# Patient Record
Sex: Male | Born: 1985 | Race: Black or African American | Hispanic: No | Marital: Single | Smoking: Former smoker
Health system: Southern US, Community
[De-identification: ages and names within clinical notes are randomized; demographics above are authoritative.]

## PROBLEM LIST (undated history)

## (undated) DIAGNOSIS — I10 Essential (primary) hypertension: Secondary | ICD-10-CM

## (undated) HISTORY — DX: Essential (primary) hypertension: I10

## (undated) HISTORY — PX: WISDOM TOOTH EXTRACTION: SHX21

---

## 2009-11-30 ENCOUNTER — Emergency Department (HOSPITAL_BASED_OUTPATIENT_CLINIC_OR_DEPARTMENT_OTHER): Admission: EM | Admit: 2009-11-30 | Discharge: 2009-11-30 | Payer: Self-pay | Admitting: Emergency Medicine

## 2009-11-30 ENCOUNTER — Ambulatory Visit: Payer: Self-pay | Admitting: Diagnostic Radiology

## 2009-12-16 ENCOUNTER — Emergency Department (HOSPITAL_BASED_OUTPATIENT_CLINIC_OR_DEPARTMENT_OTHER): Admission: EM | Admit: 2009-12-16 | Discharge: 2009-12-16 | Payer: Self-pay | Admitting: Emergency Medicine

## 2010-12-03 ENCOUNTER — Emergency Department (HOSPITAL_BASED_OUTPATIENT_CLINIC_OR_DEPARTMENT_OTHER)
Admission: EM | Admit: 2010-12-03 | Discharge: 2010-12-03 | Payer: 59 | Source: Home / Self Care | Admitting: Emergency Medicine

## 2010-12-26 ENCOUNTER — Emergency Department (HOSPITAL_COMMUNITY)
Admission: EM | Admit: 2010-12-26 | Discharge: 2010-12-26 | Payer: Self-pay | Source: Home / Self Care | Admitting: Emergency Medicine

## 2011-02-11 LAB — RPR: RPR Ser Ql: NONREACTIVE

## 2011-02-11 LAB — URINALYSIS, ROUTINE W REFLEX MICROSCOPIC
Bilirubin Urine: NEGATIVE
Glucose, UA: NEGATIVE mg/dL
Hgb urine dipstick: NEGATIVE
Ketones, ur: NEGATIVE mg/dL
Nitrite: NEGATIVE
Protein, ur: NEGATIVE mg/dL
Specific Gravity, Urine: 1.006 (ref 1.005–1.030)
Urobilinogen, UA: 0.2 mg/dL (ref 0.0–1.0)
pH: 7 (ref 5.0–8.0)

## 2011-02-11 LAB — GC/CHLAMYDIA PROBE AMP, GENITAL
Chlamydia, DNA Probe: NEGATIVE
GC Probe Amp, Genital: NEGATIVE

## 2015-05-22 ENCOUNTER — Emergency Department (HOSPITAL_COMMUNITY)
Admission: EM | Admit: 2015-05-22 | Discharge: 2015-05-23 | Disposition: A | Discharge status: Home / Self Care | Payer: 59 | Attending: Emergency Medicine | Admitting: Emergency Medicine

## 2015-05-22 ENCOUNTER — Encounter (HOSPITAL_COMMUNITY): Payer: Self-pay | Admitting: Emergency Medicine

## 2015-05-22 DIAGNOSIS — S61252A Open bite of right middle finger without damage to nail, initial encounter: Secondary | ICD-10-CM | POA: Diagnosis not present

## 2015-05-22 DIAGNOSIS — Y9289 Other specified places as the place of occurrence of the external cause: Secondary | ICD-10-CM | POA: Diagnosis not present

## 2015-05-22 DIAGNOSIS — Z720 Tobacco use: Secondary | ICD-10-CM | POA: Diagnosis not present

## 2015-05-22 DIAGNOSIS — S61259A Open bite of unspecified finger without damage to nail, initial encounter: Secondary | ICD-10-CM

## 2015-05-22 DIAGNOSIS — W57XXXA Bitten or stung by nonvenomous insect and other nonvenomous arthropods, initial encounter: Secondary | ICD-10-CM | POA: Diagnosis not present

## 2015-05-22 DIAGNOSIS — Y998 Other external cause status: Secondary | ICD-10-CM | POA: Insufficient documentation

## 2015-05-22 DIAGNOSIS — Z23 Encounter for immunization: Secondary | ICD-10-CM | POA: Insufficient documentation

## 2015-05-22 DIAGNOSIS — W5581XA Bitten by other mammals, initial encounter: Secondary | ICD-10-CM

## 2015-05-22 DIAGNOSIS — Y9389 Activity, other specified: Secondary | ICD-10-CM | POA: Insufficient documentation

## 2015-05-22 MED ORDER — RABIES VACCINE, PCEC IM SUSR
1.0000 mL | Freq: Once | INTRAMUSCULAR | Status: AC
  Administered 2015-05-22: 1 mL via INTRAMUSCULAR
  Filled 2015-05-22: qty 1

## 2015-05-22 MED ORDER — RABIES IMMUNE GLOBULIN 150 UNIT/ML IM INJ
1500.0000 [IU] | INJECTION | Freq: Once | INTRAMUSCULAR | Status: AC
  Administered 2015-05-22: 1500 [IU]
  Filled 2015-05-22: qty 10

## 2015-05-22 NOTE — ED Provider Notes (Signed)
CSN: [id number]     Arrival date & time 05/22/15  2253 History  This chart was scribed for non-physician provider Trixie Dredge, PA-C, working with Loren Racer, MD by Phillis Haggis, ED Scribe. This patient was seen in room TR05C/TR05C and patient care was started at 11:32 PM.   Chief Complaint  Patient presents with  . Animal Bite   The history is provided by the patient. No language interpreter was used.    HPI Comments: Frederick Molina is a 29 y.o. male who presents to the Emergency Department complaining of an animal bite onset earlier this evening. He states that he caught a baby bat in his bag and was bitten by the bat on his right distal middle finger. He reports the bite was a pinching sensation, states that it feels like he touched a hot pan. He denies any bleeding from the bite. He denies calling animal control; states that he released the bat outside. He reports that his last tdap has been within the past 5 years. He denies numbness or weakness, fever, chills, nausea or vomiting.   History reviewed. No pertinent past medical history. History reviewed. No pertinent past surgical history. No family history on file. History  Substance Use Topics  . Smoking status: Current Every Day Smoker  . Smokeless tobacco: Not on file  . Alcohol Use: Yes    Review of Systems  Constitutional: Negative for fever and chills.  Respiratory: Negative for cough and shortness of breath.   Gastrointestinal: Negative for nausea, vomiting and abdominal pain.  Musculoskeletal: Negative for myalgias.  Skin: Negative for wound.       Bat bite to right middle finger  Neurological: Negative for weakness and numbness.  Psychiatric/Behavioral: Negative for self-injury.  All other systems reviewed and are negative.  Allergies  Review of patient's allergies indicates no known allergies.  Home Medications   Prior to Admission medications   Not on File   BP 136/87 mmHg  Pulse 85  Temp(Src) 98.2 F  (36.8 C) (Oral)  Resp 16  Ht 5\' 3"  (1.6 m)  Wt 161 lb (73.029 kg)  BMI 28.53 kg/m2  SpO2 99%  Physical Exam  Constitutional: He appears well-developed and well-nourished. No distress.  HENT:  Head: Normocephalic and atraumatic.  Neck: Neck supple.  Cardiovascular: Normal rate and regular rhythm.   Pulmonary/Chest: Effort normal and breath sounds normal. No respiratory distress. He has no wheezes. He has no rales.  Abdominal: Soft. He exhibits no distension and no mass. There is no tenderness. There is no rebound and no guarding.  Musculoskeletal:  Right hand with full active ROM, no erythema, edema, wounds noted, capillary refill <2 seconds.   Neurological: He is alert. He exhibits normal muscle tone.  Skin: He is not diaphoretic.  Nursing note and vitals reviewed.   ED Course  Procedures (including critical care time) DIAGNOSTIC STUDIES: Oxygen Saturation is 99% on RA, normal by my interpretation.    COORDINATION OF CARE: 11:35 PM-Discussed treatment plan which includes rabies vaccination with pt at bedside and pt agreed to plan.   Labs Review Labs Reviewed - No data to display  Imaging Review No results found.   EKG Interpretation None      MDM   Final diagnoses:  Bat bite of finger, initial encounter    Afebrile, nontoxic patient with reported bat bite to right middle finger.  No symptoms, no other complaints.  Rabies vaccine and immunoglobulin given.   D/C home with instructions for continued rabies  vaccination series, return precautions.  Discussed result, findings, treatment, and follow up  with patient.  Pt given return precautions.  Pt verbalizes understanding and agrees with plan.       I personally performed the services described in this documentation, which was scribed in my presence. The recorded information has been reviewed and is accurate.    Trixie Dredge, PA-C 05/23/15 0038  Loren Racer, MD 05/25/15 [phone]

## 2015-05-22 NOTE — ED Notes (Signed)
Pt. bitten by a bat at right distal middle finger this evening  , no bleeding or swelling .

## 2015-05-23 NOTE — Discharge Instructions (Signed)
Read the information below.  You may return to the Emergency Department at any time for worsening condition or any new symptoms that concern you.  If you develop redness, swelling, pus draining from the wound, or fevers greater than 100.4, return to the ER immediately for a recheck.   °

## 2015-05-25 ENCOUNTER — Encounter (HOSPITAL_COMMUNITY): Payer: Self-pay | Admitting: Emergency Medicine

## 2015-05-25 ENCOUNTER — Emergency Department (INDEPENDENT_AMBULATORY_CARE_PROVIDER_SITE_OTHER)
Admission: EM | Admit: 2015-05-25 | Discharge: 2015-05-25 | Disposition: A | Discharge status: Home / Self Care | Payer: 59 | Source: Home / Self Care

## 2015-05-25 DIAGNOSIS — Z203 Contact with and (suspected) exposure to rabies: Secondary | ICD-10-CM

## 2015-05-25 MED ORDER — RABIES VACCINE, PCEC IM SUSR: 1.0000 mL | Freq: Once | INTRAMUSCULAR | Status: DC

## 2015-05-25 MED ORDER — RABIES VACCINE, PCEC IM SUSR
INTRAMUSCULAR | Status: AC
  Filled 2015-05-25: qty 1

## 2015-05-25 MED ORDER — RABIES VACCINE, PCEC IM SUSR
1.0000 mL | Freq: Once | INTRAMUSCULAR | Status: AC
Start: 2015-05-25 — End: 2015-05-25
  Administered 2015-05-25: 1 mL via INTRAMUSCULAR

## 2015-05-25 NOTE — ED Provider Notes (Signed)
Patient presents to Urgent Care for rabies vaccine. He was seen in the ED on the 20th for a bat bite. He received the appropriate treatment and is in urgent care to continue his rabies vaccine series.   Rodolph Bong, MD 05/25/15 [phone]

## 2015-05-25 NOTE — ED Notes (Signed)
Here for day 3 rabies vaccination (#2) Voices no concerns Alert, no signs of acute distress.

## 2015-05-25 NOTE — Discharge Instructions (Signed)
Please return on 6/27 for #3 (day 7)

## 2015-05-30 ENCOUNTER — Emergency Department (HOSPITAL_COMMUNITY)
Admission: EM | Admit: 2015-05-30 | Discharge: 2015-05-30 | Disposition: A | Discharge status: Home / Self Care | Payer: Self-pay

## 2015-05-30 ENCOUNTER — Encounter (HOSPITAL_COMMUNITY): Payer: Self-pay

## 2015-05-30 ENCOUNTER — Emergency Department (INDEPENDENT_AMBULATORY_CARE_PROVIDER_SITE_OTHER)
Admission: EM | Admit: 2015-05-30 | Discharge: 2015-05-30 | Disposition: A | Discharge status: Home / Self Care | Payer: 59 | Source: Home / Self Care

## 2015-05-30 DIAGNOSIS — Z203 Contact with and (suspected) exposure to rabies: Secondary | ICD-10-CM

## 2015-05-30 MED ORDER — RABIES VACCINE, PCEC IM SUSR
1.0000 mL | Freq: Once | INTRAMUSCULAR | Status: AC
  Administered 2015-05-30: 1 mL via INTRAMUSCULAR

## 2015-05-30 NOTE — ED Notes (Signed)
Here for day #7 of rabies series initated in ED for reported bat bite

## 2015-06-06 ENCOUNTER — Emergency Department (INDEPENDENT_AMBULATORY_CARE_PROVIDER_SITE_OTHER)
Admission: EM | Admit: 2015-06-06 | Discharge: 2015-06-06 | Disposition: A | Discharge status: Home / Self Care | Payer: 59 | Source: Home / Self Care

## 2015-06-06 ENCOUNTER — Encounter (HOSPITAL_COMMUNITY): Payer: Self-pay

## 2015-06-06 DIAGNOSIS — Z203 Contact with and (suspected) exposure to rabies: Secondary | ICD-10-CM

## 2015-06-06 MED ORDER — RABIES VACCINE, PCEC IM SUSR
1.0000 mL | Freq: Once | INTRAMUSCULAR | Status: AC
  Administered 2015-06-06: 1 mL via INTRAMUSCULAR

## 2015-06-06 NOTE — ED Notes (Signed)
Here for rabies shot , NAD

## 2015-08-09 ENCOUNTER — Encounter (HOSPITAL_COMMUNITY): Payer: Self-pay | Admitting: *Deleted

## 2015-08-09 ENCOUNTER — Emergency Department (HOSPITAL_COMMUNITY)
Admission: EM | Admit: 2015-08-09 | Discharge: 2015-08-09 | Disposition: A | Discharge status: Home / Self Care | Payer: 59 | Attending: Emergency Medicine | Admitting: Emergency Medicine

## 2015-08-09 DIAGNOSIS — Z720 Tobacco use: Secondary | ICD-10-CM | POA: Insufficient documentation

## 2015-08-09 DIAGNOSIS — H109 Unspecified conjunctivitis: Secondary | ICD-10-CM | POA: Diagnosis not present

## 2015-08-09 DIAGNOSIS — H578 Other specified disorders of eye and adnexa: Secondary | ICD-10-CM | POA: Diagnosis present

## 2015-08-09 MED ORDER — FLUORESCEIN SODIUM 1 MG OP STRP
1.0000 | ORAL_STRIP | Freq: Once | OPHTHALMIC | Status: AC
  Administered 2015-08-09: 1 via OPHTHALMIC
  Filled 2015-08-09: qty 1

## 2015-08-09 MED ORDER — TETRACAINE HCL 0.5 % OP SOLN
2.0000 [drp] | Freq: Once | OPHTHALMIC | Status: AC
  Administered 2015-08-09: 2 [drp] via OPHTHALMIC
  Filled 2015-08-09: qty 2

## 2015-08-09 MED ORDER — ERYTHROMYCIN 5 MG/GM OP OINT
1.0000 "application " | TOPICAL_OINTMENT | Freq: Once | OPHTHALMIC | Status: AC
  Administered 2015-08-09: 1 via OPHTHALMIC
  Filled 2015-08-09: qty 3.5

## 2015-08-09 NOTE — ED Provider Notes (Signed)
CSN: [id number]     Arrival date & time 08/09/15  1206 History  This chart was scribed for non-physician practitioner, Kerrie Buffalo, NP working with Azalia Bilis, MD by Evon Slack, ED scribe. This patient was seen in room TR04C/TR04C and the patient's care was started at 12:18 PM   Chief Complaint  Patient presents with  . Conjunctivitis   Patient is a 29 y.o. male presenting with eye problem. The history is provided by the patient. No language interpreter was used.  Eye Problem Location:  R eye Severity:  Mild Duration:  1 day Timing:  Constant Chronicity:  New Context: not direct trauma   Relieved by:  None tried Worsened by:  Bright light Associated symptoms: crusting, discharge, itching and redness    HPI Comments: Dionisio Aragones is a 29 y.o. male who presents to the Emergency Department complaining of new right eye redness onset this morning. Pt states that he has had associated discharge, itching and blurred vision this morning when waking up. Pt doesn't report any injury or trauma to the eye. Pt does report cutting grass yesterday and that something may have flew into his eye. 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 He states that today his eye has been feeling tight and dry. He denies using any medications on the eye. Pt doesn't report any other symptoms.   History reviewed. No pertinent past medical history. History reviewed. No pertinent past surgical history. No family history on file. Social History  Substance Use Topics  . Smoking status: Current Every Day Smoker  . Smokeless tobacco: None  . Alcohol Use: Yes    Review of Systems  Eyes: Positive for discharge, redness, itching and visual disturbance.  All other systems reviewed and are negative.  Visual acuity R 20/50, L 20/30  Allergies  Review of patient's allergies indicates no known allergies.  Home  Medications   Prior to Admission medications   Not on File   BP 118/72 mmHg  Pulse 78  Temp(Src) 98.3 F (36.8 C) (Oral)  Resp 16  Ht  (1.626 m)  Wt 157 lb (71.215 kg)  BMI 26.94 kg/m2  SpO2 98%   Physical Exam  Constitutional: He appears well-developed and well-nourished. No distress.  HENT:  Head: Normocephalic and atraumatic.  Eyes: EOM are normal. Pupils are equal, round, and reactive to light. Lids are everted and swept, no foreign bodies found. Right eye exhibits exudate. Right eye exhibits no hordeolum. No foreign body present in the right eye. Right conjunctiva is injected.  Fundoscopic exam:      The right eye shows no papilledema.  Slit lamp exam:      The right eye shows no fluorescein uptake.  Right eye scleral injected with exudate present.   Neck: Neck supple. No tracheal deviation present.  Cardiovascular: Normal rate.   Pulmonary/Chest: Effort normal. No respiratory distress.  Skin: Skin is warm and dry.  Psychiatric: He has a normal mood and affect. His behavior is normal.  Nursing note and vitals reviewed.   ED Course  Procedures   DIAGNOSTIC STUDIES: Oxygen Saturation is 100% on RA, normal by my interpretation.    COORDINATION OF CARE: 1:12 PM Discussed treatment plan with pt at bedside and pt agreed to plan. 1:22 PM- Pt will be discharged with erythromycin with first dose starting after discharge and will continue for 5-7 days. If symptoms persist will follow up  with optometrist.       MDM  29 y.o. 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 Will treat for conjunctivitis and he will follow up with opthalmology if symptoms persist.    Final diagnoses:  Conjunctivitis of right eye   I personally performed the services described in this documentation, which was scribed in my presence. The recorded information has been reviewed and is accurate.    [address] [place],  [place] 08/09/15 1551  Gerhard Munch, MD 08/09/15 [phone]

## 2015-08-09 NOTE — ED Notes (Signed)
Pt states that he woke up with "crust" on his eye. Pt states that his rt eye is itching and is red.

## 2015-08-09 NOTE — Discharge Instructions (Signed)
Use the eye ointment three times a day. Follow up with the eye doctor if symptoms persist.  Conjunctivitis Conjunctivitis is commonly called "pink eye." Conjunctivitis can be caused by bacterial or viral infection, allergies, or injuries. There is usually redness of the lining of the eye, itching, discomfort, and sometimes discharge. There may be deposits of matter along the eyelids. A viral infection usually causes a watery discharge, while a bacterial infection causes a yellowish, thick discharge. Pink eye is very contagious and spreads by direct contact. You may be given antibiotic eyedrops as part of your treatment. Before using your eye medicine, remove all drainage from the eye by washing gently with warm water and cotton balls. Continue to use the medication until you have awakened 2 mornings in a row without discharge from the eye. Do not rub your eye. This increases the irritation and helps spread infection. Use separate towels from other household members. Wash your hands with soap and water before and after touching your eyes. Use cold compresses to reduce pain and sunglasses to relieve irritation from light. Do not wear contact lenses or wear eye makeup until the infection is gone. SEEK MEDICAL CARE IF:   Your symptoms are not better after 3 days of treatment.  You have increased pain or trouble seeing.  The outer eyelids become very red or swollen. Document Released: 12/26/2004 Document Revised: 02/10/2012 Document Reviewed: 11/18/2005 [facility] Patient Information 2015 [place], [place]. This information is not intended to replace advice given to you by your health care provider. Make sure you discuss any questions you have with your health care provider.

## 2017-01-08 ENCOUNTER — Ambulatory Visit (HOSPITAL_COMMUNITY)
Admission: EM | Admit: 2017-01-08 | Discharge: 2017-01-08 | Disposition: A | Discharge status: Home / Self Care | Payer: 59 | Attending: Family Medicine | Admitting: Family Medicine

## 2017-01-08 ENCOUNTER — Encounter (HOSPITAL_COMMUNITY): Payer: Self-pay | Admitting: Emergency Medicine

## 2017-01-08 DIAGNOSIS — R6889 Other general symptoms and signs: Secondary | ICD-10-CM

## 2017-01-08 DIAGNOSIS — J111 Influenza due to unidentified influenza virus with other respiratory manifestations: Secondary | ICD-10-CM

## 2017-01-08 MED ORDER — HYDROCODONE-HOMATROPINE 5-1.5 MG/5ML PO SYRP: 5.0000 mL | ORAL_SOLUTION | Freq: Four times a day (QID) | ORAL | 0 refills | Status: DC | PRN

## 2017-01-08 MED ORDER — OSELTAMIVIR PHOSPHATE 75 MG PO CAPS: 75.0000 mg | ORAL_CAPSULE | Freq: Two times a day (BID) | ORAL | 0 refills | Status: DC

## 2017-01-08 MED ORDER — ACETAMINOPHEN 325 MG PO TABS
ORAL_TABLET | ORAL | Status: AC
  Filled 2017-01-08: qty 2

## 2017-01-08 MED ORDER — ACETAMINOPHEN 325 MG PO TABS
650.0000 mg | ORAL_TABLET | Freq: Once | ORAL | Status: AC
  Administered 2017-01-08: 650 mg via ORAL

## 2017-01-08 NOTE — Discharge Instructions (Signed)
I believe you have the influenza germ that has been going around. You need to get rest, push fluids, take ibuprofen as needed for fever and get started immediately on the influenza medicine tonight.

## 2017-01-08 NOTE — ED Provider Notes (Signed)
[facility]    CSN: [id number] Arrival date & time: 01/08/17  1939     History   Chief Complaint Chief Complaint  Patient presents with  . Fatigue  . Cough  . Nasal Congestion  . Chills    HPI Frederick Molina is a 31 y.o. male.   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 He started feeling like he was coming down with a cold last night and by this morning was running a fever and developed rhinorrhea and cough. He now has myalgia as well.  Patient's had no nausea or vomiting, no productive cough, no rash or sore throat.      History reviewed. No pertinent past medical history.  There are no active problems to display for this patient.   History reviewed. No pertinent surgical history.     Home Medications    Prior to Admission medications   Medication Sig Start Date End Date Taking? Authorizing Provider  HYDROcodone-homatropine (HYDROMET) 5-1.5 MG/5ML syrup Take 5 mLs by mouth every 6 (six) hours as needed for cough. 01/08/17   Elvina Sidle, MD  oseltamivir (TAMIFLU) 75 MG capsule Take 1 capsule (75 mg total) by mouth every 12 (twelve) hours. 01/08/17   Elvina Sidle, MD    Family History No family history on file.  Social History Social History  Substance Use Topics  . Smoking status: Former Games developer  . Smokeless tobacco: Former Neurosurgeon  . Alcohol use Yes     Allergies   Patient has no known allergies.   Review of Systems Review of Systems  Constitutional: Positive for chills, diaphoresis, fatigue and fever.  HENT: Positive for rhinorrhea. Negative for congestion and sore throat.   Respiratory: Positive for cough.   Gastrointestinal: Negative.   Genitourinary: Negative.   Musculoskeletal: Positive for myalgias.  Neurological: Positive for headaches.     Physical Exam Triage Vital Signs ED Triage Vitals  Enc Vitals Group     BP 01/08/17 2009 125/69     Pulse Rate 01/08/17 2009 99     Resp 01/08/17 2009 17      Temp 01/08/17 2009 101.8 F (38.8 C)     Temp Source 01/08/17 2009 Oral     SpO2 01/08/17 2009 99 %     Weight 01/08/17 2007 146 lb 5 oz (66.4 kg)     Height 01/08/17 2007 5\' 5"  (1.651 m)     Head Circumference --      Peak Flow --      Pain Score 01/08/17 2008 9     Pain Loc --      Pain Edu? --      Excl. in GC? --    No data found.   Updated Vital Signs BP 125/69 (BP Location: Right Arm)   Pulse 99   Temp 101.8 F (38.8 C) (Oral)   Resp 17   Ht 5\' 5"  (1.651 m)   Wt 146 lb 5 oz (66.4 kg)   SpO2 99%   BMI 24.35 kg/m    Physical Exam  Constitutional: He is oriented to person, place, and time. He appears well-developed and well-nourished.  HENT:  Right Ear: External ear normal.  Left Ear: External ear normal.  Mouth/Throat: Oropharynx is clear and moist.  Eyes: Conjunctivae and EOM are normal. Pupils are equal, round, and reactive to light.  Neck: Normal range of motion. Neck supple.  Cardiovascular: Normal rate, regular rhythm and normal heart sounds.   Pulmonary/Chest: Effort  normal and breath sounds normal.  Musculoskeletal: Normal range of motion.  Neurological: He is alert and oriented to person, place, and time.  Skin: Skin is warm and dry.  Nursing note and vitals reviewed.    UC Treatments / Results  Labs (all labs ordered are listed, but only abnormal results are displayed) Labs Reviewed - No data to display  EKG  EKG Interpretation None       Radiology No results found.  Procedures Procedures (including critical care time)  Medications Ordered in UC Medications  acetaminophen (TYLENOL) tablet 650 mg (650 mg Oral Given 01/08/17 2014)     Initial Impression / Assessment and Plan / UC Course  I have reviewed the triage vital signs and the nursing notes.  Pertinent labs & imaging results that were available during my care of the patient were reviewed by me and considered in my medical decision making (see chart for details).      Final Clinical Impressions(s) / UC Diagnoses   Final diagnoses:  Flu-like symptoms  Influenza-like syndrome    New Prescriptions New Prescriptions   HYDROCODONE-HOMATROPINE (HYDROMET) 5-1.5 MG/5ML SYRUP    Take 5 mLs by mouth every 6 (six) hours as needed for cough.   OSELTAMIVIR (TAMIFLU) 75 MG CAPSULE    Take 1 capsule (75 mg total) by mouth every 12 (twelve) hours.     Elvina SidleKurt Kosei Rhodes, MD 01/08/17 2048

## 2017-01-08 NOTE — ED Triage Notes (Signed)
Pt. Stated, Im fatigue  And I have a cough, this started this morning.

## 2018-05-01 ENCOUNTER — Encounter (HOSPITAL_COMMUNITY): Admission: EM | Disposition: A | Discharge status: Rehabilitation Facility | Payer: Self-pay | Source: Home / Self Care

## 2018-05-01 ENCOUNTER — Encounter (HOSPITAL_COMMUNITY): Payer: Self-pay | Admitting: Emergency Medicine

## 2018-05-01 ENCOUNTER — Inpatient Hospital Stay (HOSPITAL_COMMUNITY)
Admission: EM | Admit: 2018-05-01 | Discharge: 2018-06-04 | DRG: 955 | Disposition: A | Discharge status: Rehabilitation Facility | Payer: Self-pay | Attending: General Surgery | Admitting: General Surgery

## 2018-05-01 ENCOUNTER — Emergency Department (HOSPITAL_COMMUNITY): Payer: Self-pay

## 2018-05-01 ENCOUNTER — Other Ambulatory Visit: Payer: Self-pay

## 2018-05-01 DIAGNOSIS — J9600 Acute respiratory failure, unspecified whether with hypoxia or hypercapnia: Secondary | ICD-10-CM

## 2018-05-01 DIAGNOSIS — Z23 Encounter for immunization: Secondary | ICD-10-CM

## 2018-05-01 DIAGNOSIS — I959 Hypotension, unspecified: Secondary | ICD-10-CM | POA: Diagnosis present

## 2018-05-01 DIAGNOSIS — E870 Hyperosmolality and hypernatremia: Secondary | ICD-10-CM | POA: Diagnosis not present

## 2018-05-01 DIAGNOSIS — S080XXA Avulsion of scalp, initial encounter: Secondary | ICD-10-CM | POA: Diagnosis present

## 2018-05-01 DIAGNOSIS — S0291XB Unspecified fracture of skull, initial encounter for open fracture: Secondary | ICD-10-CM | POA: Diagnosis present

## 2018-05-01 DIAGNOSIS — L8990 Pressure ulcer of unspecified site, unspecified stage: Secondary | ICD-10-CM

## 2018-05-01 DIAGNOSIS — S40012A Contusion of left shoulder, initial encounter: Secondary | ICD-10-CM | POA: Diagnosis present

## 2018-05-01 DIAGNOSIS — Y92410 Unspecified street and highway as the place of occurrence of the external cause: Secondary | ICD-10-CM

## 2018-05-01 DIAGNOSIS — S2243XA Multiple fractures of ribs, bilateral, initial encounter for closed fracture: Secondary | ICD-10-CM | POA: Diagnosis present

## 2018-05-01 DIAGNOSIS — K567 Ileus, unspecified: Secondary | ICD-10-CM

## 2018-05-01 DIAGNOSIS — Z978 Presence of other specified devices: Secondary | ICD-10-CM

## 2018-05-01 DIAGNOSIS — E440 Moderate protein-calorie malnutrition: Secondary | ICD-10-CM | POA: Diagnosis not present

## 2018-05-01 DIAGNOSIS — I82409 Acute embolism and thrombosis of unspecified deep veins of unspecified lower extremity: Secondary | ICD-10-CM

## 2018-05-01 DIAGNOSIS — Z6825 Body mass index (BMI) 25.0-25.9, adult: Secondary | ICD-10-CM

## 2018-05-01 DIAGNOSIS — S0240DA Maxillary fracture, left side, initial encounter for closed fracture: Secondary | ICD-10-CM | POA: Diagnosis present

## 2018-05-01 DIAGNOSIS — E874 Mixed disorder of acid-base balance: Secondary | ICD-10-CM | POA: Diagnosis not present

## 2018-05-01 DIAGNOSIS — S40011A Contusion of right shoulder, initial encounter: Secondary | ICD-10-CM | POA: Diagnosis present

## 2018-05-01 DIAGNOSIS — S36116A Major laceration of liver, initial encounter: Secondary | ICD-10-CM | POA: Diagnosis present

## 2018-05-01 DIAGNOSIS — I824Z3 Acute embolism and thrombosis of unspecified deep veins of distal lower extremity, bilateral: Secondary | ICD-10-CM | POA: Diagnosis not present

## 2018-05-01 DIAGNOSIS — R402312 Coma scale, best motor response, none, at arrival to emergency department: Secondary | ICD-10-CM | POA: Diagnosis present

## 2018-05-01 DIAGNOSIS — J9811 Atelectasis: Secondary | ICD-10-CM | POA: Diagnosis not present

## 2018-05-01 DIAGNOSIS — Z4659 Encounter for fitting and adjustment of other gastrointestinal appliance and device: Secondary | ICD-10-CM

## 2018-05-01 DIAGNOSIS — S020XXB Fracture of vault of skull, initial encounter for open fracture: Principal | ICD-10-CM | POA: Diagnosis present

## 2018-05-01 DIAGNOSIS — A689 Relapsing fever, unspecified: Secondary | ICD-10-CM

## 2018-05-01 DIAGNOSIS — J9601 Acute respiratory failure with hypoxia: Secondary | ICD-10-CM

## 2018-05-01 DIAGNOSIS — J9690 Respiratory failure, unspecified, unspecified whether with hypoxia or hypercapnia: Secondary | ICD-10-CM

## 2018-05-01 DIAGNOSIS — N179 Acute kidney failure, unspecified: Secondary | ICD-10-CM | POA: Diagnosis present

## 2018-05-01 DIAGNOSIS — J690 Pneumonitis due to inhalation of food and vomit: Secondary | ICD-10-CM

## 2018-05-01 DIAGNOSIS — J189 Pneumonia, unspecified organism: Secondary | ICD-10-CM

## 2018-05-01 DIAGNOSIS — R739 Hyperglycemia, unspecified: Secondary | ICD-10-CM | POA: Diagnosis present

## 2018-05-01 DIAGNOSIS — Y908 Blood alcohol level of 240 mg/100 ml or more: Secondary | ICD-10-CM | POA: Diagnosis present

## 2018-05-01 DIAGNOSIS — Y95 Nosocomial condition: Secondary | ICD-10-CM | POA: Diagnosis not present

## 2018-05-01 DIAGNOSIS — N390 Urinary tract infection, site not specified: Secondary | ICD-10-CM | POA: Diagnosis not present

## 2018-05-01 DIAGNOSIS — R000 Tachycardia, unspecified: Secondary | ICD-10-CM

## 2018-05-01 DIAGNOSIS — R0902 Hypoxemia: Secondary | ICD-10-CM

## 2018-05-01 DIAGNOSIS — E871 Hypo-osmolality and hyponatremia: Secondary | ICD-10-CM | POA: Diagnosis not present

## 2018-05-01 DIAGNOSIS — S0240CA Maxillary fracture, right side, initial encounter for closed fracture: Secondary | ICD-10-CM | POA: Diagnosis present

## 2018-05-01 DIAGNOSIS — J80 Acute respiratory distress syndrome: Secondary | ICD-10-CM

## 2018-05-01 DIAGNOSIS — J939 Pneumothorax, unspecified: Secondary | ICD-10-CM

## 2018-05-01 DIAGNOSIS — Z4682 Encounter for fitting and adjustment of non-vascular catheter: Secondary | ICD-10-CM

## 2018-05-01 DIAGNOSIS — R402112 Coma scale, eyes open, never, at arrival to emergency department: Secondary | ICD-10-CM | POA: Diagnosis present

## 2018-05-01 DIAGNOSIS — F17210 Nicotine dependence, cigarettes, uncomplicated: Secondary | ICD-10-CM | POA: Diagnosis present

## 2018-05-01 DIAGNOSIS — E876 Hypokalemia: Secondary | ICD-10-CM | POA: Diagnosis not present

## 2018-05-01 DIAGNOSIS — D62 Acute posthemorrhagic anemia: Secondary | ICD-10-CM

## 2018-05-01 DIAGNOSIS — R7881 Bacteremia: Secondary | ICD-10-CM | POA: Diagnosis not present

## 2018-05-01 DIAGNOSIS — S27321A Contusion of lung, unilateral, initial encounter: Secondary | ICD-10-CM | POA: Diagnosis present

## 2018-05-01 DIAGNOSIS — Z01818 Encounter for other preprocedural examination: Secondary | ICD-10-CM

## 2018-05-01 DIAGNOSIS — S066X9A Traumatic subarachnoid hemorrhage with loss of consciousness of unspecified duration, initial encounter: Secondary | ICD-10-CM | POA: Diagnosis not present

## 2018-05-01 DIAGNOSIS — S2249XA Multiple fractures of ribs, unspecified side, initial encounter for closed fracture: Secondary | ICD-10-CM

## 2018-05-01 DIAGNOSIS — S299XXA Unspecified injury of thorax, initial encounter: Secondary | ICD-10-CM

## 2018-05-01 DIAGNOSIS — J151 Pneumonia due to Pseudomonas: Secondary | ICD-10-CM

## 2018-05-01 DIAGNOSIS — R402212 Coma scale, best verbal response, none, at arrival to emergency department: Secondary | ICD-10-CM | POA: Diagnosis present

## 2018-05-01 DIAGNOSIS — F1010 Alcohol abuse, uncomplicated: Secondary | ICD-10-CM

## 2018-05-01 DIAGNOSIS — S270XXA Traumatic pneumothorax, initial encounter: Secondary | ICD-10-CM | POA: Diagnosis present

## 2018-05-01 DIAGNOSIS — R0682 Tachypnea, not elsewhere classified: Secondary | ICD-10-CM

## 2018-05-01 DIAGNOSIS — Z9289 Personal history of other medical treatment: Secondary | ICD-10-CM

## 2018-05-01 HISTORY — PX: CRANIECTOMY FOR DEPRESSED SKULL FRACTURE: SHX5788

## 2018-05-01 LAB — I-STAT CHEM 8, ED
BUN: 8 mg/dL (ref 6–20)
CALCIUM ION: 1 mmol/L — AB (ref 1.15–1.40)
Chloride: 105 mmol/L (ref 101–111)
Creatinine, Ser: 1.6 mg/dL — ABNORMAL HIGH (ref 0.61–1.24)
Glucose, Bld: 180 mg/dL — ABNORMAL HIGH (ref 65–99)
HEMATOCRIT: 46 % (ref 39.0–52.0)
HEMOGLOBIN: 15.6 g/dL (ref 13.0–17.0)
Potassium: 3.1 mmol/L — ABNORMAL LOW (ref 3.5–5.1)
SODIUM: 139 mmol/L (ref 135–145)
TCO2: 18 mmol/L — AB (ref 22–32)

## 2018-05-01 LAB — URINALYSIS, ROUTINE W REFLEX MICROSCOPIC
BACTERIA UA: NONE SEEN
Bilirubin Urine: NEGATIVE
GLUCOSE, UA: NEGATIVE mg/dL
KETONES UR: NEGATIVE mg/dL
Leukocytes, UA: NEGATIVE
Nitrite: NEGATIVE
PROTEIN: NEGATIVE mg/dL
Specific Gravity, Urine: 1.005 (ref 1.005–1.030)
pH: 6 (ref 5.0–8.0)

## 2018-05-01 LAB — PREPARE FRESH FROZEN PLASMA: UNIT DIVISION: 0

## 2018-05-01 LAB — BPAM FFP
BLOOD PRODUCT EXPIRATION DATE: 201906052359
Blood Product Expiration Date: 201906052359
ISSUE DATE / TIME: 201905312158
ISSUE DATE / TIME: 201905312158
Unit Type and Rh: 6200
Unit Type and Rh: 6200

## 2018-05-01 LAB — CDS SEROLOGY

## 2018-05-01 LAB — I-STAT ARTERIAL BLOOD GAS, ED
Acid-base deficit: 10 mmol/L — ABNORMAL HIGH (ref 0.0–2.0)
Bicarbonate: 16.9 mmol/L — ABNORMAL LOW (ref 20.0–28.0)
O2 SAT: 100 %
PCO2 ART: 40.6 mmHg (ref 32.0–48.0)
PO2 ART: 291 mmHg — AB (ref 83.0–108.0)
Patient temperature: 98.6
TCO2: 18 mmol/L — AB (ref 22–32)
pH, Arterial: 7.227 — ABNORMAL LOW (ref 7.350–7.450)

## 2018-05-01 LAB — PROTIME-INR
INR: 1.21
PROTHROMBIN TIME: 15.2 s (ref 11.4–15.2)

## 2018-05-01 LAB — COMPREHENSIVE METABOLIC PANEL
ALBUMIN: 3.9 g/dL (ref 3.5–5.0)
ALT: 608 U/L — ABNORMAL HIGH (ref 17–63)
ANION GAP: 15 (ref 5–15)
AST: 633 U/L — AB (ref 15–41)
Alkaline Phosphatase: 46 U/L (ref 38–126)
BILIRUBIN TOTAL: 1.4 mg/dL — AB (ref 0.3–1.2)
BUN: 8 mg/dL (ref 6–20)
CHLORIDE: 107 mmol/L (ref 101–111)
CO2: 17 mmol/L — AB (ref 22–32)
Calcium: 8.5 mg/dL — ABNORMAL LOW (ref 8.9–10.3)
Creatinine, Ser: 1.47 mg/dL — ABNORMAL HIGH (ref 0.61–1.24)
GFR calc Af Amer: 32 mL/min — ABNORMAL LOW (ref >60)
GFR calc non Af Amer: 27 mL/min — ABNORMAL LOW (ref >60)
GLUCOSE: 181 mg/dL — AB (ref 65–99)
POTASSIUM: 3.2 mmol/L — AB (ref 3.5–5.1)
SODIUM: 139 mmol/L (ref 135–145)
TOTAL PROTEIN: 6.5 g/dL (ref 6.5–8.1)

## 2018-05-01 LAB — CBC
HEMATOCRIT: 44.4 % (ref 39.0–52.0)
HEMOGLOBIN: 14.4 g/dL (ref 13.0–17.0)
MCH: 28.7 pg (ref 26.0–34.0)
MCHC: 32.4 g/dL (ref 30.0–36.0)
MCV: 88.6 fL (ref 78.0–100.0)
Platelets: 269 10*3/uL (ref 150–400)
RBC: 5.01 MIL/uL (ref 4.22–5.81)
RDW: 12.7 % (ref 11.5–15.5)
WBC: 7.3 10*3/uL (ref 4.0–10.5)

## 2018-05-01 LAB — I-STAT CG4 LACTIC ACID, ED: LACTIC ACID, VENOUS: 6.21 mmol/L — AB (ref 0.5–1.9)

## 2018-05-01 LAB — ETHANOL: Alcohol, Ethyl (B): 244 mg/dL — ABNORMAL HIGH (ref <10)

## 2018-05-01 SURGERY — CRANIECTOMY FOR DEPRESSED SKULL FRACTURE
Anesthesia: General

## 2018-05-01 MED ORDER — LIDOCAINE-EPINEPHRINE 1 %-1:100000 IJ SOLN
INTRAMUSCULAR | Status: AC
  Filled 2018-05-01: qty 1

## 2018-05-01 MED ORDER — CEFAZOLIN SODIUM-DEXTROSE 2-4 GM/100ML-% IV SOLN
2.0000 g | Freq: Once | INTRAVENOUS | Status: AC
  Administered 2018-05-01: 2 g via INTRAVENOUS
  Filled 2018-05-01: qty 100

## 2018-05-01 MED ORDER — IOHEXOL 300 MG/ML  SOLN
100.0000 mL | Freq: Once | INTRAMUSCULAR | Status: AC | PRN
  Administered 2018-05-01: 100 mL via INTRAVENOUS

## 2018-05-01 MED ORDER — LACTATED RINGERS IV BOLUS
1000.0000 mL | Freq: Once | INTRAVENOUS | Status: AC
  Administered 2018-05-01: 1000 mL via INTRAVENOUS

## 2018-05-01 MED ORDER — LACTATED RINGERS IV SOLN
INTRAVENOUS | Status: AC | PRN
  Administered 2018-05-01: 1000 mL via INTRAVENOUS

## 2018-05-01 MED ORDER — FENTANYL CITRATE (PF) 250 MCG/5ML IJ SOLN
INTRAMUSCULAR | Status: AC
Start: 2018-05-01 — End: ?
  Filled 2018-05-01: qty 5

## 2018-05-01 MED ORDER — SODIUM CHLORIDE 0.9 % IV SOLN
INTRAVENOUS | Status: AC | PRN
  Administered 2018-05-01: 1000 mL via INTRAVENOUS

## 2018-05-01 MED ORDER — THROMBIN 20000 UNITS EX SOLR
CUTANEOUS | Status: AC
  Filled 2018-05-01: qty 20000

## 2018-05-01 MED ORDER — BUPIVACAINE HCL (PF) 0.5 % IJ SOLN
INTRAMUSCULAR | Status: AC
  Filled 2018-05-01: qty 30

## 2018-05-01 MED ORDER — TETANUS-DIPHTH-ACELL PERTUSSIS 5-2.5-18.5 LF-MCG/0.5 IM SUSP
0.5000 mL | Freq: Once | INTRAMUSCULAR | Status: AC
  Administered 2018-05-01: 0.5 mL via INTRAMUSCULAR

## 2018-05-01 MED ORDER — BACITRACIN ZINC 500 UNIT/GM EX OINT
TOPICAL_OINTMENT | CUTANEOUS | Status: AC
  Filled 2018-05-01: qty 28.35

## 2018-05-01 MED ORDER — SUCCINYLCHOLINE CHLORIDE 20 MG/ML IJ SOLN
INTRAMUSCULAR | Status: AC | PRN
  Administered 2018-05-01: 100 mg via INTRAVENOUS

## 2018-05-01 MED ORDER — PROPOFOL 10 MG/ML IV BOLUS
INTRAVENOUS | Status: AC
  Filled 2018-05-01: qty 20

## 2018-05-01 MED ORDER — METHYLENE BLUE 0.5 % INJ SOLN
INTRAVENOUS | Status: AC
  Filled 2018-05-01: qty 10

## 2018-05-01 SURGICAL SUPPLY — 61 items
APPLICATOR COTTON TIP 6IN STRL (MISCELLANEOUS) ×2 IMPLANT
BAG DECANTER FOR FLEXI CONT (MISCELLANEOUS) ×4 IMPLANT
BANDAGE GAUZE 4  KLING STR (GAUZE/BANDAGES/DRESSINGS) IMPLANT
BIT DRILL WIRE PASS 1.3MM (BIT) IMPLANT
BNDG GAUZE ELAST 4 BULKY (GAUZE/BANDAGES/DRESSINGS) ×4 IMPLANT
BNDG STRETCH 4X75 NS LF (GAUZE/BANDAGES/DRESSINGS) ×4 IMPLANT
BUR ACORN 6.0 PRECISION (BURR) ×2 IMPLANT
BUR SPIRAL ROUTER 2.3 (BUR) ×2 IMPLANT
CANISTER SUCT 3000ML PPV (MISCELLANEOUS) ×4 IMPLANT
CARTRIDGE OIL MAESTRO DRILL (MISCELLANEOUS) ×1 IMPLANT
DIFFUSER DRILL AIR PNEUMATIC (MISCELLANEOUS) ×2 IMPLANT
DRAIN PENROSE 1/2X12 LTX STRL (WOUND CARE) IMPLANT
DRAPE NEUROLOGICAL W/INCISE (DRAPES) ×2 IMPLANT
DRAPE SURG 17X23 STRL (DRAPES) IMPLANT
DRAPE WARM FLUID 44X44 (DRAPE) ×2 IMPLANT
DRILL WIRE PASS 1.3MM (BIT)
DRSG ADAPTIC 3X8 NADH LF (GAUZE/BANDAGES/DRESSINGS) ×2 IMPLANT
DRSG PAD ABDOMINAL 8X10 ST (GAUZE/BANDAGES/DRESSINGS) IMPLANT
ELECT REM PT RETURN 9FT ADLT (ELECTROSURGICAL) ×2
ELECTRODE REM PT RTRN 9FT ADLT (ELECTROSURGICAL) ×1 IMPLANT
EVACUATOR SILICONE 100CC (DRAIN) IMPLANT
GAUZE SPONGE 4X4 12PLY STRL (GAUZE/BANDAGES/DRESSINGS) ×2 IMPLANT
GAUZE SPONGE 4X4 12PLY STRL LF (GAUZE/BANDAGES/DRESSINGS) ×2 IMPLANT
GAUZE SPONGE 4X4 16PLY XRAY LF (GAUZE/BANDAGES/DRESSINGS) IMPLANT
GLOVE BIOGEL PI IND STRL 8 (GLOVE) ×3 IMPLANT
GLOVE BIOGEL PI INDICATOR 8 (GLOVE) ×3
GLOVE ECLIPSE 7.5 STRL STRAW (GLOVE) ×6 IMPLANT
GOWN STRL REUS W/ TWL LRG LVL3 (GOWN DISPOSABLE) IMPLANT
GOWN STRL REUS W/ TWL XL LVL3 (GOWN DISPOSABLE) ×1 IMPLANT
GOWN STRL REUS W/TWL 2XL LVL3 (GOWN DISPOSABLE) ×2 IMPLANT
GOWN STRL REUS W/TWL LRG LVL3 (GOWN DISPOSABLE)
GOWN STRL REUS W/TWL XL LVL3 (GOWN DISPOSABLE) ×1
GRAFT DURAGEN MATRIX 1WX1L (Tissue) ×2 IMPLANT
HEMOSTAT POWDER SURGIFOAM 1G (HEMOSTASIS) ×2 IMPLANT
HEMOSTAT SURGICEL 2X14 (HEMOSTASIS) ×2 IMPLANT
HOOK DURA (MISCELLANEOUS) ×2 IMPLANT
KIT BASIN OR (CUSTOM PROCEDURE TRAY) ×2 IMPLANT
KIT TURNOVER KIT B (KITS) ×2 IMPLANT
NEEDLE SPNL 22GX3.5 QUINCKE BK (NEEDLE) ×2 IMPLANT
NS IRRIG 1000ML POUR BTL (IV SOLUTION) ×2 IMPLANT
OIL CARTRIDGE MAESTRO DRILL (MISCELLANEOUS) ×2
PACK CRANIOTOMY CUSTOM (CUSTOM PROCEDURE TRAY) ×2 IMPLANT
PAD ARMBOARD 7.5X6 YLW CONV (MISCELLANEOUS) ×2 IMPLANT
PATTIES SURGICAL .5 X.5 (GAUZE/BANDAGES/DRESSINGS) IMPLANT
PATTIES SURGICAL .5 X3 (DISPOSABLE) IMPLANT
PATTIES SURGICAL 1/4 X 3 (GAUZE/BANDAGES/DRESSINGS) IMPLANT
PATTIES SURGICAL 1X1 (DISPOSABLE) IMPLANT
PIN MAYFIELD SKULL DISP (PIN) ×2 IMPLANT
SEALANT ADHERUS EXTEND TIP (MISCELLANEOUS) ×2 IMPLANT
SPECIMEN JAR SMALL (MISCELLANEOUS) IMPLANT
SPONGE NEURO XRAY DETECT 1X3 (DISPOSABLE) IMPLANT
SPONGE SURGIFOAM ABS GEL 100 (HEMOSTASIS) ×2 IMPLANT
STAPLER SKIN PROX WIDE 3.9 (STAPLE) ×2 IMPLANT
SUT ETHILON 3 0 FSL (SUTURE) ×2 IMPLANT
SUT NURALON 4 0 TR CR/8 (SUTURE) ×2 IMPLANT
SUT VIC AB 2-0 CP2 18 (SUTURE) ×6 IMPLANT
SYR CONTROL 10ML LL (SYRINGE) ×2 IMPLANT
TOWEL GREEN STERILE (TOWEL DISPOSABLE) ×2 IMPLANT
TOWEL GREEN STERILE FF (TOWEL DISPOSABLE) ×2 IMPLANT
UNDERPAD 30X30 (UNDERPADS AND DIAPERS) ×4 IMPLANT
WATER STERILE IRR 1000ML POUR (IV SOLUTION) ×2 IMPLANT

## 2018-05-01 NOTE — ED Notes (Signed)
Pt has decerebrate posturing.  Dr. Lindie SpruceWyatt notified and is at bedside.
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
## 2018-05-01 NOTE — ED Notes (Signed)
Pt to CT on monitor with RN and RT.
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
## 2018-05-01 NOTE — ED Notes (Signed)
Returned from CT.
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
## 2018-05-02 ENCOUNTER — Emergency Department (HOSPITAL_COMMUNITY): Payer: Self-pay | Admitting: Anesthesiology

## 2018-05-02 DIAGNOSIS — S0291XB Unspecified fracture of skull, initial encounter for open fracture: Secondary | ICD-10-CM | POA: Diagnosis present

## 2018-05-02 LAB — POCT I-STAT 7, (LYTES, BLD GAS, ICA,H+H)
ACID-BASE DEFICIT: 10 mmol/L — AB (ref 0.0–2.0)
Bicarbonate: 16.5 mmol/L — ABNORMAL LOW (ref 20.0–28.0)
Calcium, Ion: 1.02 mmol/L — ABNORMAL LOW (ref 1.15–1.40)
HCT: 33 % — ABNORMAL LOW (ref 39.0–52.0)
HEMOGLOBIN: 11.2 g/dL — AB (ref 13.0–17.0)
O2 Saturation: 100 %
PCO2 ART: 36.7 mmHg (ref 32.0–48.0)
PO2 ART: 214 mmHg — AB (ref 83.0–108.0)
POTASSIUM: 3.1 mmol/L — AB (ref 3.5–5.1)
Sodium: 142 mmol/L (ref 135–145)
TCO2: 18 mmol/L — ABNORMAL LOW (ref 22–32)
pH, Arterial: 7.251 — ABNORMAL LOW (ref 7.350–7.450)

## 2018-05-02 LAB — BPAM RBC
BLOOD PRODUCT EXPIRATION DATE: 201906292359
Blood Product Expiration Date: 201906292359
ISSUE DATE / TIME: 201905312156
ISSUE DATE / TIME: 201905312156
UNIT TYPE AND RH: 9500
Unit Type and Rh: 9500

## 2018-05-02 LAB — TYPE AND SCREEN
ABO/RH(D): B POS
Antibody Screen: NEGATIVE
Unit division: 0
Unit division: 0

## 2018-05-02 LAB — LACTIC ACID, PLASMA: LACTIC ACID, VENOUS: 4.6 mmol/L — AB (ref 0.5–1.9)

## 2018-05-02 LAB — BASIC METABOLIC PANEL
Anion gap: 9 (ref 5–15)
BUN: 7 mg/dL (ref 6–20)
CO2: 16 mmol/L — ABNORMAL LOW (ref 22–32)
CREATININE: 1.05 mg/dL (ref 0.61–1.24)
Calcium: 6.9 mg/dL — ABNORMAL LOW (ref 8.9–10.3)
Chloride: 114 mmol/L — ABNORMAL HIGH (ref 101–111)
Glucose, Bld: 144 mg/dL — ABNORMAL HIGH (ref 65–99)
Potassium: 5.2 mmol/L — ABNORMAL HIGH (ref 3.5–5.1)
SODIUM: 139 mmol/L (ref 135–145)

## 2018-05-02 LAB — CBC
HCT: 35.4 % — ABNORMAL LOW (ref 39.0–52.0)
HEMOGLOBIN: 11.5 g/dL — AB (ref 13.0–17.0)
MCH: 29.3 pg (ref 26.0–34.0)
MCHC: 32.5 g/dL (ref 30.0–36.0)
MCV: 90.1 fL (ref 78.0–100.0)
PLATELETS: 222 10*3/uL (ref 150–400)
RBC: 3.93 MIL/uL — AB (ref 4.22–5.81)
RDW: 13.1 % (ref 11.5–15.5)
WBC: 16.9 10*3/uL — ABNORMAL HIGH (ref 4.0–10.5)

## 2018-05-02 LAB — ABO/RH: ABO/RH(D): B POS

## 2018-05-02 LAB — HIV ANTIBODY (ROUTINE TESTING W REFLEX): HIV SCREEN 4TH GENERATION: NONREACTIVE

## 2018-05-02 LAB — TRIGLYCERIDES: Triglycerides: 109 mg/dL (ref <150)

## 2018-05-02 MED ORDER — ROCURONIUM BROMIDE 100 MG/10ML IV SOLN
INTRAVENOUS | Status: DC | PRN
  Administered 2018-05-02 (×3): 50 mg via INTRAVENOUS

## 2018-05-02 MED ORDER — PROPOFOL 1000 MG/100ML IV EMUL
5.0000 ug/kg/min | INTRAVENOUS | Status: DC
  Administered 2018-05-02 – 2018-05-03 (×6): 30 ug/kg/min via INTRAVENOUS
  Administered 2018-05-03 – 2018-05-04 (×2): 50 ug/kg/min via INTRAVENOUS
  Administered 2018-05-04 (×3): 40 ug/kg/min via INTRAVENOUS
  Administered 2018-05-05 – 2018-05-08 (×19): 50 ug/kg/min via INTRAVENOUS
  Administered 2018-05-09: 80 ug/kg/min via INTRAVENOUS
  Administered 2018-05-09: 50 ug/kg/min via INTRAVENOUS
  Administered 2018-05-09: 80 ug/kg/min via INTRAVENOUS
  Administered 2018-05-09: 50 ug/kg/min via INTRAVENOUS
  Administered 2018-05-09: 80 ug/kg/min via INTRAVENOUS
  Administered 2018-05-09: 50 ug/kg/min via INTRAVENOUS
  Administered 2018-05-09: 80 ug/kg/min via INTRAVENOUS
  Administered 2018-05-09: 50 ug/kg/min via INTRAVENOUS
  Administered 2018-05-10 – 2018-05-11 (×12): 80 ug/kg/min via INTRAVENOUS
  Filled 2018-05-02 (×12): qty 100
  Filled 2018-05-02: qty 200
  Filled 2018-05-02 (×42): qty 100

## 2018-05-02 MED ORDER — METOPROLOL TARTRATE 5 MG/5ML IV SOLN
5.0000 mg | Freq: Four times a day (QID) | INTRAVENOUS | Status: DC | PRN
  Administered 2018-05-13: 5 mg via INTRAVENOUS
  Filled 2018-05-02: qty 5

## 2018-05-02 MED ORDER — SODIUM CHLORIDE 0.9 % IV BOLUS
1000.0000 mL | Freq: Once | INTRAVENOUS | Status: AC
  Administered 2018-05-02: 1000 mL via INTRAVENOUS

## 2018-05-02 MED ORDER — PROPOFOL 1000 MG/100ML IV EMUL
INTRAVENOUS | Status: AC | PRN
  Administered 2018-05-01: 10 ug/kg/min via INTRAVENOUS

## 2018-05-02 MED ORDER — THROMBIN 5000 UNITS EX SOLR
OROMUCOSAL | Status: DC | PRN
  Administered 2018-05-02: 01:00:00 via TOPICAL

## 2018-05-02 MED ORDER — POTASSIUM CHLORIDE IN NACL 20-0.9 MEQ/L-% IV SOLN
INTRAVENOUS | Status: DC
  Administered 2018-05-02 – 2018-05-03 (×3): via INTRAVENOUS
  Filled 2018-05-02 (×4): qty 1000

## 2018-05-02 MED ORDER — SODIUM CHLORIDE 0.9 % IV SOLN
INTRAVENOUS | Status: AC | PRN
  Administered 2018-05-01: 100 mL/h via INTRAVENOUS

## 2018-05-02 MED ORDER — PHENYLEPHRINE HCL-NACL 10-0.9 MG/250ML-% IV SOLN
0.0000 ug/min | INTRAVENOUS | Status: DC
  Administered 2018-05-02: 20 ug/min via INTRAVENOUS
  Administered 2018-05-02: 80 ug/min via INTRAVENOUS
  Administered 2018-05-02: 100 ug/min via INTRAVENOUS
  Administered 2018-05-02: 20 ug/min via INTRAVENOUS
  Filled 2018-05-02 (×6): qty 250

## 2018-05-02 MED ORDER — SODIUM CHLORIDE 0.9 % IV SOLN
INTRAVENOUS | Status: DC | PRN
  Administered 2018-05-02 (×3): via INTRAVENOUS

## 2018-05-02 MED ORDER — SODIUM CHLORIDE 0.9 % IV SOLN
INTRAVENOUS | Status: DC | PRN
  Administered 2018-05-02: via INTRAVENOUS

## 2018-05-02 MED ORDER — FAMOTIDINE IN NACL 20-0.9 MG/50ML-% IV SOLN
20.0000 mg | Freq: Two times a day (BID) | INTRAVENOUS | Status: DC
  Administered 2018-05-02 – 2018-05-04 (×6): 20 mg via INTRAVENOUS
  Filled 2018-05-02 (×6): qty 50

## 2018-05-02 MED ORDER — ACETAMINOPHEN 650 MG RE SUPP
650.0000 mg | RECTAL | Status: DC | PRN
  Administered 2018-05-16: 650 mg via RECTAL
  Filled 2018-05-02: qty 1

## 2018-05-02 MED ORDER — FENTANYL CITRATE (PF) 250 MCG/5ML IJ SOLN
INTRAMUSCULAR | Status: DC | PRN
  Administered 2018-05-01 – 2018-05-02 (×5): 50 ug via INTRAVENOUS

## 2018-05-02 MED ORDER — HYDRALAZINE HCL 20 MG/ML IJ SOLN
10.0000 mg | INTRAMUSCULAR | Status: DC | PRN
  Administered 2018-05-13: 10 mg via INTRAVENOUS
  Filled 2018-05-02: qty 1

## 2018-05-02 MED ORDER — CHLORHEXIDINE GLUCONATE 0.12% ORAL RINSE (MEDLINE KIT)
15.0000 mL | Freq: Two times a day (BID) | OROMUCOSAL | Status: DC
  Administered 2018-05-02 – 2018-06-04 (×67): 15 mL via OROMUCOSAL

## 2018-05-02 MED ORDER — ORAL CARE MOUTH RINSE
15.0000 mL | OROMUCOSAL | Status: DC
  Administered 2018-05-02 – 2018-05-29 (×271): 15 mL via OROMUCOSAL

## 2018-05-02 MED ORDER — CEFAZOLIN SODIUM-DEXTROSE 1-4 GM/50ML-% IV SOLN
1.0000 g | Freq: Three times a day (TID) | INTRAVENOUS | Status: DC
  Administered 2018-05-02 – 2018-05-05 (×10): 1 g via INTRAVENOUS
  Filled 2018-05-02 (×10): qty 50

## 2018-05-02 MED ORDER — PANTOPRAZOLE SODIUM 40 MG PO TBEC
40.0000 mg | DELAYED_RELEASE_TABLET | Freq: Two times a day (BID) | ORAL | Status: DC
  Filled 2018-05-02: qty 1

## 2018-05-02 MED ORDER — HEMOSTATIC AGENTS (NO CHARGE) OPTIME
TOPICAL | Status: DC | PRN
  Administered 2018-05-02 (×2): 1 via TOPICAL

## 2018-05-02 MED ORDER — 0.9 % SODIUM CHLORIDE (POUR BTL) OPTIME
TOPICAL | Status: DC | PRN
  Administered 2018-05-02 (×3): 1000 mL

## 2018-05-02 MED ORDER — FENTANYL BOLUS VIA INFUSION
50.0000 ug | INTRAVENOUS | Status: DC | PRN
  Administered 2018-05-03 – 2018-05-10 (×2): 50 ug via INTRAVENOUS
  Filled 2018-05-02: qty 50

## 2018-05-02 MED ORDER — THROMBIN 20000 UNITS EX SOLR
CUTANEOUS | Status: DC | PRN
  Administered 2018-05-02: 01:00:00 via TOPICAL

## 2018-05-02 MED ORDER — FENTANYL 2500MCG IN NS 250ML (10MCG/ML) PREMIX INFUSION
25.0000 ug/h | INTRAVENOUS | Status: DC
  Administered 2018-05-02 (×2): 100 ug/h via INTRAVENOUS
  Administered 2018-05-03: 125 ug/h via INTRAVENOUS
  Administered 2018-05-04: 250 ug/h via INTRAVENOUS
  Administered 2018-05-04: 300 ug/h via INTRAVENOUS
  Administered 2018-05-04: 250 ug/h via INTRAVENOUS
  Administered 2018-05-05 (×2): 300 ug/h via INTRAVENOUS
  Administered 2018-05-06: 350 ug/h via INTRAVENOUS
  Administered 2018-05-06 (×3): 300 ug/h via INTRAVENOUS
  Administered 2018-05-07 (×3): 350 ug/h via INTRAVENOUS
  Administered 2018-05-08 (×2): 400 ug/h via INTRAVENOUS
  Administered 2018-05-08: 350 ug/h via INTRAVENOUS
  Administered 2018-05-08 – 2018-05-12 (×13): 400 ug/h via INTRAVENOUS
  Administered 2018-05-12 (×2): 375 ug/h via INTRAVENOUS
  Administered 2018-05-12 – 2018-05-14 (×8): 400 ug/h via INTRAVENOUS
  Administered 2018-05-14: 100 ug/h via INTRAVENOUS
  Administered 2018-05-15 – 2018-05-17 (×9): 400 ug/h via INTRAVENOUS
  Administered 2018-05-17: 125 ug/h via INTRAVENOUS
  Administered 2018-05-17: 325 ug/h via INTRAVENOUS
  Administered 2018-05-17: 400 ug/h via INTRAVENOUS
  Administered 2018-05-18: 150 ug/h via INTRAVENOUS
  Administered 2018-05-18: 275 ug/h via INTRAVENOUS
  Administered 2018-05-18: 250 ug/h via INTRAVENOUS
  Administered 2018-05-19 (×3): 275 ug/h via INTRAVENOUS
  Administered 2018-05-20: 225 ug/h via INTRAVENOUS
  Administered 2018-05-21: 150 ug/h via INTRAVENOUS
  Administered 2018-05-21: 200 ug/h via INTRAVENOUS
  Administered 2018-05-22: 175 ug/h via INTRAVENOUS
  Administered 2018-05-22 – 2018-05-23 (×2): 100 ug/h via INTRAVENOUS
  Administered 2018-05-24 – 2018-05-25 (×2): 150 ug/h via INTRAVENOUS
  Administered 2018-05-25: 125 ug/h via INTRAVENOUS
  Administered 2018-05-26: 100 ug/h via INTRAVENOUS
  Administered 2018-05-27: 175 ug/h via INTRAVENOUS
  Administered 2018-05-28: 100 ug/h via INTRAVENOUS
  Administered 2018-05-29: 150 ug/h via INTRAVENOUS
  Filled 2018-05-02 (×76): qty 250

## 2018-05-02 MED ORDER — CEFAZOLIN SODIUM-DEXTROSE 1-4 GM/50ML-% IV SOLN
INTRAVENOUS | Status: DC | PRN
  Administered 2018-05-02: 1 g via INTRAVENOUS

## 2018-05-02 MED ORDER — DOCUSATE SODIUM 50 MG/5ML PO LIQD
100.0000 mg | Freq: Two times a day (BID) | ORAL | Status: DC | PRN
  Administered 2018-05-10 – 2018-05-12 (×3): 100 mg
  Filled 2018-05-02 (×3): qty 10

## 2018-05-02 MED ORDER — PROPOFOL 500 MG/50ML IV EMUL
INTRAVENOUS | Status: DC | PRN
  Administered 2018-05-02: 40 ug/kg/min via INTRAVENOUS

## 2018-05-02 MED ORDER — SODIUM CHLORIDE 0.9 % IV SOLN
INTRAVENOUS | Status: DC | PRN
  Administered 2018-05-02 (×2)

## 2018-05-02 MED ORDER — FENTANYL CITRATE (PF) 100 MCG/2ML IJ SOLN: 50.0000 ug | Freq: Once | INTRAMUSCULAR | Status: DC

## 2018-05-02 MED ORDER — CEFAZOLIN SODIUM-DEXTROSE 2-3 GM-%(50ML) IV SOLR
INTRAVENOUS | Status: DC | PRN
  Administered 2018-05-02: 2 g via INTRAVENOUS

## 2018-05-02 MED ORDER — THROMBIN 5000 UNITS EX SOLR
CUTANEOUS | Status: AC
  Filled 2018-05-02: qty 5000

## 2018-05-02 MED ORDER — ACETAMINOPHEN 160 MG/5ML PO SOLN
650.0000 mg | ORAL | Status: DC | PRN
  Administered 2018-05-03 – 2018-05-30 (×41): 650 mg
  Filled 2018-05-02 (×43): qty 20.3

## 2018-05-02 NOTE — ED Notes (Signed)
Propofol was paused per Dr. Ruthell RummageLuckey and bilateral IV's increased to bolus.
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
## 2018-05-02 NOTE — Op Note (Signed)
05/02/2018  2:00 AM  PATIENT:  Frederick ForesterSteven Cheramie Jr.  32 y.o. male  PRE-OPERATIVE DIAGNOSIS: Right frontoparietal open depressed skull fracture with large partial scalp avulsion associated with stellate laceration approximately 20 cm in total length  POST-OPERATIVE DIAGNOSIS:  Right frontoparietal open depressed skull fracture with large partial scalp avulsion associated with stellate laceration approximately 20 cm in total length  PROCEDURE:  Procedure(s): Right frontoparietal craniectomy and debridement of open depressed skull fracture; repair of full-thickness complex scalp laceration of approximately 20 cm in total length  SURGEON:  Surgeon(s): Shirlean KellyNudelman, Robert, MD  ANESTHESIA:   general  EBL:  Total I/O In: 5650 [I.V.:5650] Out: 1250 [Urine:1050; Blood:200]  BLOOD ADMINISTERED:none  COUNT:  Correct per nursing staff  DICTATION: Patient was brought to the operating room from the emergency room, already intubated, and was placed under general endotracheal anesthesia.  The patient was placed in a 3 pin [place] head holder and rolls were placed behind the right shoulder, and he was gently turned to the left.  The scalp around the incision was carefully shaved using electric clippers.  The right side of the head was then prepped with Betadine soap and solution, and draped in a sterile fashion.  The flap of the scalp avulsion was elevated, and the area irrigated.  There was evidence of skull fragments, hair, and what appeared to be straw.  The loose skull fragments were removed as well as the hair and straw.  It was irrigated extensively with saline solution and subsequently bacitracin solution.  An area of the dura was seen to be pulsating nicely, but there remained aspects of the stellate depressed skull fracture there was still compressing the dura.  We therefore extended the craniectomy removing portions of the skull including the depressed portions of the inner table.  The dura was  decompressed.  We then tacked up the dura to the skull with 4-0 Nurolon suture.  A small pledget of DuraGen was placed over the tack up suture, and some Adheris was applied hemostasis was established.  There was irrigated with saline and bacitracin solution, and then we proceeded with closure.  The laceration was closed in multiple layers.  Muscle and fascia were approximated with 2-0 undyed Vicryl.  The subcutaneous and subcuticular closed with interrupted inverted 2-0 Vicryl sutures.  The skin edges were closed with surgical staples and 3-0 nylon.  The wound was dressed with Adaptic and sterile gauze, and wrapped with Kerlix and Kling.  Following surgery patient was left intubated to be transferred to the intensive care unit for further care.  PLAN OF CARE: ICU  PATIENT DISPOSITION:  ICU - intubated and hemodynamically stable.   Delay start of Pharmacological VTE agent (>24hrs) due to surgical blood loss or risk of bleeding:  yes

## 2018-05-02 NOTE — ED Notes (Signed)
To or 0000a
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
## 2018-05-02 NOTE — Progress Notes (Signed)
Pt transported to OR on Vent without complications

## 2018-05-02 NOTE — Anesthesia Procedure Notes (Signed)
Arterial Line Insertion Start/End6/12/2017 12:25 AM, 05/02/2018 12:40 AM Performed by: Marcene DuosFitzgerald, Robert, MD, anesthesiologist  Preanesthetic checklist: patient identified, IV checked, risks and benefits discussed, monitors and equipment checked, pre-op evaluation and timeout performed radial was placed Catheter size: 20 G Hand hygiene performed  and maximum sterile barriers used   Attempts: 1 Procedure performed without using ultrasound guided technique. Ultrasound Notes:anatomy identified, needle tip was noted to be adjacent to the nerve/plexus identified and no ultrasound evidence of intravascular and/or intraneural injection Following insertion, Biopatch. Patient tolerated the procedure well with no immediate complications.
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
## 2018-05-02 NOTE — Transfer of Care (Signed)
Immediate Anesthesia Transfer of Care Note  Patient: Frederick ForesterSteven Hafer Jr.  Procedure(s) Performed: CRANIECTOMY AND REPAIR OF SCALP LACERATIONS (N/A )  Patient Location: ICU  Anesthesia Type:General  Level of Consciousness: sedated and Patient remains intubated per anesthesia plan  Airway & Oxygen Therapy: Patient remains intubated per anesthesia plan and Patient placed on Ventilator (see vital sign flow sheet for setting)  Post-op Assessment: Report given to RN and Post -op Vital signs reviewed and stable  Post vital signs: Reviewed and stable  Last Vitals:  Vitals Value Taken Time  BP 108/68 05/02/2018  2:19 AM  Temp    Pulse 112 05/02/2018  2:24 AM  Resp 27 05/02/2018  2:24 AM  SpO2 100 % 05/02/2018  2:24 AM  Vitals shown include unvalidated device data.  Last Pain:  Vitals:   05/01/18 2222  TempSrc: Bladder         Complications: No apparent anesthesia complications

## 2018-05-02 NOTE — Progress Notes (Signed)
CRITICAL VALUE ALERT  Critical Value:  Lactic Acid: 4.6  Date & Time Notied:  05/02/2018 [id number]  Provider Notified: Dr. Donell BeersByerly  Orders Received/Actions taken: No new orders at this time.

## 2018-05-03 ENCOUNTER — Inpatient Hospital Stay (HOSPITAL_COMMUNITY): Payer: Self-pay

## 2018-05-03 LAB — CBC WITH DIFFERENTIAL/PLATELET
Basophils Absolute: 0 10*3/uL (ref 0.0–0.1)
Basophils Relative: 0 %
EOS PCT: 0 %
Eosinophils Absolute: 0 10*3/uL (ref 0.0–0.7)
HCT: 23.4 % — ABNORMAL LOW (ref 39.0–52.0)
Hemoglobin: 7.7 g/dL — ABNORMAL LOW (ref 13.0–17.0)
LYMPHS ABS: 0.9 10*3/uL (ref 0.7–4.0)
Lymphocytes Relative: 10 %
MCH: 29.3 pg (ref 26.0–34.0)
MCHC: 32.9 g/dL (ref 30.0–36.0)
MCV: 89 fL (ref 78.0–100.0)
MONO ABS: 0.7 10*3/uL (ref 0.1–1.0)
Monocytes Relative: 8 %
Neutro Abs: 7.7 10*3/uL (ref 1.7–7.7)
Neutrophils Relative %: 82 %
PLATELETS: 124 10*3/uL — AB (ref 150–400)
RBC: 2.63 MIL/uL — AB (ref 4.22–5.81)
RDW: 12.9 % (ref 11.5–15.5)
WBC: 9.3 10*3/uL (ref 4.0–10.5)

## 2018-05-03 LAB — BASIC METABOLIC PANEL
ANION GAP: 4 — AB (ref 5–15)
BUN: 7 mg/dL (ref 6–20)
CALCIUM: 7.1 mg/dL — AB (ref 8.9–10.3)
CO2: 23 mmol/L (ref 22–32)
CREATININE: 0.89 mg/dL (ref 0.61–1.24)
Chloride: 111 mmol/L (ref 101–111)
GFR calc Af Amer: >60 mL/min (ref >60)
Glucose, Bld: 113 mg/dL — ABNORMAL HIGH (ref 65–99)
Potassium: 3.9 mmol/L (ref 3.5–5.1)
Sodium: 138 mmol/L (ref 135–145)

## 2018-05-03 LAB — CBC
HCT: 23.4 % — ABNORMAL LOW (ref 39.0–52.0)
Hemoglobin: 7.7 g/dL — ABNORMAL LOW (ref 13.0–17.0)
MCH: 29.1 pg (ref 26.0–34.0)
MCHC: 32.9 g/dL (ref 30.0–36.0)
MCV: 88.3 fL (ref 78.0–100.0)
PLATELETS: 122 10*3/uL — AB (ref 150–400)
RBC: 2.65 MIL/uL — ABNORMAL LOW (ref 4.22–5.81)
RDW: 12.9 % (ref 11.5–15.5)
WBC: 7.2 10*3/uL (ref 4.0–10.5)

## 2018-05-03 LAB — MAGNESIUM
MAGNESIUM: 1.6 mg/dL — AB (ref 1.7–2.4)
MAGNESIUM: 1.7 mg/dL (ref 1.7–2.4)

## 2018-05-03 LAB — PHOSPHORUS
Phosphorus: 1.4 mg/dL — ABNORMAL LOW (ref 2.5–4.6)
Phosphorus: 1.1 mg/dL — ABNORMAL LOW (ref 2.5–4.6)

## 2018-05-03 MED ORDER — SODIUM CHLORIDE 0.9 % IV SOLN
INTRAVENOUS | Status: DC
  Administered 2018-05-03 – 2018-05-06 (×3): via INTRAVENOUS
  Administered 2018-05-07: 1000 mL via INTRAVENOUS
  Administered 2018-05-07 – 2018-05-09 (×2): via INTRAVENOUS
  Administered 2018-05-09: 1000 mL via INTRAVENOUS

## 2018-05-03 MED ORDER — PRO-STAT SUGAR FREE PO LIQD
30.0000 mL | Freq: Two times a day (BID) | ORAL | Status: DC
  Administered 2018-05-03 – 2018-05-04 (×3): 30 mL
  Filled 2018-05-03 (×3): qty 30

## 2018-05-03 MED ORDER — VITAL HIGH PROTEIN PO LIQD
1000.0000 mL | ORAL | Status: DC
  Administered 2018-05-03: 1000 mL
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
## 2018-05-03 NOTE — Plan of Care (Signed)
Patient rests well with fentanyl and propofol in place.

## 2018-05-03 NOTE — Anesthesia Postprocedure Evaluation (Signed)
Anesthesia Post Note  Patient: Frederick ForesterSteven Bohlen Jr.  Procedure(s) Performed: CRANIECTOMY AND REPAIR OF SCALP LACERATIONS (N/A )     Patient location during evaluation: SICU Anesthesia Type: General Level of consciousness: sedated Pain management: pain level controlled Vital Signs Assessment: post-procedure vital signs reviewed and stable Respiratory status: patient remains intubated per anesthesia plan Cardiovascular status: stable Postop Assessment: no apparent nausea or vomiting Anesthetic complications: no    Last Vitals:  Vitals:   05/03/18 0700 05/03/18 0808  BP: 115/66 116/68  Pulse: 98 94  Resp: 17 17  Temp: 37.7 C   SpO2: 100% 100%    Last Pain:  Vitals:   05/02/18 1200  TempSrc: Bladder                 Kennieth RadFitzgerald, Jenee Spaugh E

## 2018-05-03 NOTE — Progress Notes (Signed)
Spoke with Dr. Janee Mornhompson of Trauma Service regarding the patient's spike in temperature, heart rate, respiratory rate, and blood pressure in spite of Tylenol and ice packs. He advised an increase in sedation and placing the patient on a cooling blanket. Propofol increased from 30 mcg/kg/min to 40 mcg/kg/min and fentanyl increased from 100 mcg to 125 with a 50 mcg bolus given. Will continue to monitor the patient and update as needed.

## 2018-05-03 NOTE — Procedures (Signed)
Chest Tube Insertion Procedure Note Late Entry  Indications:  Clinically significant right pneumothorax  Pre-operative Diagnosis: R Pneumothorax  Post-operative Diagnosis: R Pneumothorax  Procedure Details  Informed consent was obtained for the procedure, including sedation.  Risks of lung perforation, hemorrhage, arrhythmia, and adverse drug reaction were discussed.   After sterile skin prep, using standard technique, a 14 French tube was placed in the R anterior axillary line above the nipple level using Seldinger technique.  Findings: Air rush  Estimated Blood Loss:  less than 50 mL         Specimens:  None              Complications:  None; patient tolerated the procedure well.         Disposition: ICU - intubated and critically ill.         Condition: stable  Violeta GelinasBurke Jahzier Villalon, MD, MPH, FACS Trauma: [phone] General Surgery: [phone]

## 2018-05-04 ENCOUNTER — Encounter (HOSPITAL_COMMUNITY): Payer: Self-pay | Admitting: Neurosurgery

## 2018-05-04 ENCOUNTER — Inpatient Hospital Stay (HOSPITAL_COMMUNITY): Payer: Self-pay

## 2018-05-04 LAB — POCT I-STAT 3, ART BLOOD GAS (G3+)
Bicarbonate: 26.4 mmol/L (ref 20.0–28.0)
O2 Saturation: 96 %
PH ART: 7.312 — AB (ref 7.350–7.450)
TCO2: 28 mmol/L (ref 22–32)
pCO2 arterial: 52.2 mmHg — ABNORMAL HIGH (ref 32.0–48.0)
pO2, Arterial: 93 mmHg (ref 83.0–108.0)

## 2018-05-04 LAB — BLOOD PRODUCT ORDER (VERBAL) VERIFICATION

## 2018-05-04 LAB — GLUCOSE, CAPILLARY
GLUCOSE-CAPILLARY: 124 mg/dL — AB (ref 65–99)
Glucose-Capillary: 114 mg/dL — ABNORMAL HIGH (ref 65–99)
Glucose-Capillary: 123 mg/dL — ABNORMAL HIGH (ref 65–99)
Glucose-Capillary: 112 mg/dL — ABNORMAL HIGH (ref 65–99)
Glucose-Capillary: 130 mg/dL — ABNORMAL HIGH (ref 65–99)

## 2018-05-04 LAB — CBC
HCT: 23.1 % — ABNORMAL LOW (ref 39.0–52.0)
Hemoglobin: 7.6 g/dL — ABNORMAL LOW (ref 13.0–17.0)
MCH: 29.6 pg (ref 26.0–34.0)
MCHC: 32.9 g/dL (ref 30.0–36.0)
MCV: 89.9 fL (ref 78.0–100.0)
Platelets: 117 10*3/uL — ABNORMAL LOW (ref 150–400)
RBC: 2.57 MIL/uL — ABNORMAL LOW (ref 4.22–5.81)
RDW: 13 % (ref 11.5–15.5)
WBC: 2.6 10*3/uL — ABNORMAL LOW (ref 4.0–10.5)

## 2018-05-04 LAB — BASIC METABOLIC PANEL
Anion gap: 5 (ref 5–15)
BUN: 8 mg/dL (ref 6–20)
CALCIUM: 7.4 mg/dL — AB (ref 8.9–10.3)
CO2: 26 mmol/L (ref 22–32)
CREATININE: 0.85 mg/dL (ref 0.61–1.24)
Chloride: 109 mmol/L (ref 101–111)
GFR calc non Af Amer: >60 mL/min (ref >60)
Glucose, Bld: 131 mg/dL — ABNORMAL HIGH (ref 65–99)
Potassium: 4 mmol/L (ref 3.5–5.1)
SODIUM: 140 mmol/L (ref 135–145)

## 2018-05-04 LAB — PHOSPHORUS
PHOSPHORUS: 1.8 mg/dL — AB (ref 2.5–4.6)
Phosphorus: 2.3 mg/dL — ABNORMAL LOW (ref 2.5–4.6)

## 2018-05-04 LAB — MAGNESIUM
MAGNESIUM: 1.6 mg/dL — AB (ref 1.7–2.4)
MAGNESIUM: 1.7 mg/dL (ref 1.7–2.4)

## 2018-05-04 MED ORDER — PANTOPRAZOLE SODIUM 40 MG PO PACK
40.0000 mg | PACK | Freq: Two times a day (BID) | ORAL | Status: DC
  Administered 2018-05-04 (×2): 40 mg
  Filled 2018-05-04 (×2): qty 20

## 2018-05-04 MED ORDER — PRO-STAT SUGAR FREE PO LIQD
30.0000 mL | Freq: Three times a day (TID) | ORAL | Status: DC
  Administered 2018-05-04 – 2018-05-11 (×22): 30 mL
  Filled 2018-05-04 (×21): qty 30

## 2018-05-04 MED ORDER — PIVOT 1.5 CAL PO LIQD
1000.0000 mL | ORAL | Status: DC
  Administered 2018-05-04 – 2018-05-11 (×8): 1000 mL

## 2018-05-04 NOTE — Progress Notes (Signed)
Subjective: Patient continues on ventilator via ETT.  Nursing staff reports that they have not been able to make progress with weaning from ventilator.  They did change the dressing, and reported that the wound is healing nicely.  Sedated with propofol and fentanyl.  Objective: Vital signs in last 24 hours: Vitals:   05/04/18 1500 05/04/18 1509 05/04/18 1600 05/04/18 1700  BP: 107/64 107/64  115/64  Pulse: (!) 110 (!) 109  (!) 111  Resp: 15 16  17   Temp: 99.7 F (37.6 C)   (!) 100.4 F (38 C)  TempSrc:      SpO2: 100% 100%  100%  Weight:   83.7 kg (184 lb 8.4 oz)   Height:        Intake/Output from previous day: 06/02 0701 - 06/03 0700 In: 2405.6 [I.V.:1915.6; NG/GT:240; IV Piggyback:250] Out: 2320 [Urine:1550; Emesis/NG output:60; Chest Tube:710] Intake/Output this shift: Total I/O In: 1364.4 [I.V.:1274.4; NG/GT:90] Out: 725 [Urine:725]  Physical Exam: Exam limited due to sedation.  CBC Recent Labs    05/03/18 1724 05/04/18 0517  WBC 7.2 2.6*  HGB 7.7* 7.6*  HCT 23.4* 23.1*  PLT 122* 117*   BMET Recent Labs    05/03/18 1108 05/04/18 0517  NA 138 140  K 3.9 4.0  CL 111 109  CO2 23 26  GLUCOSE 113* 131*  BUN 7 8  CREATININE 0.89 0.85  CALCIUM 7.1* 7.4*   ABG    Component Value Date/Time   PHART 7.312 (L) 05/04/2018 1103   PCO2ART 52.2 (H) 05/04/2018 1103   PO2ART 93.0 05/04/2018 1103   HCO3 26.4 05/04/2018 1103   TCO2 28 05/04/2018 1103   ACIDBASEDEF 10.0 (H) 05/02/2018 0040   O2SAT 96.0 05/04/2018 1103    Studies/Results: Ct Head Wo Contrast  Result Date: 05/03/2018 CLINICAL DATA:  Follow-up head trauma. EXAM: CT HEAD WITHOUT CONTRAST TECHNIQUE: Contiguous axial images were obtained from the base of the skull through the vertex without intravenous contrast. COMPARISON:  CT HEAD May 01, 2018 FINDINGS: BRAIN: No mass effect nor midline shift. The ventricles and sulci are normal. No acute large vascular territory infarcts. Trace LEFT ambient cistern  subarachnoid hemorrhage. Subcentimeter suspected RIGHT cerebellar hemorrhagic contusion. No hydrocephalus or parenchymal brain volume loss. Basal cisterns are patent. VASCULAR: Unremarkable. SKULL/SOFT TISSUES: Large RIGHT scalp hematoma with skin staples. Comminuted RIGHT frontotemporal skull fracture with interval removal of displaced fragment. ORBITS/SINUSES: The included ocular globes and orbital contents are normal.RIGHT greater than LEFT maxillary sinus air-fluid level, potential hemosinus. Mild paranasal sinus mucosal thickening. Mastoid air cells are well aerated. OTHER: Life-support lines in place. IMPRESSION: 1. Interval removal of small RIGHT calvarial fracture fragment with large scalp hematoma. 2. Trace subarachnoid hemorrhage. Subcentimeter potential hemorrhage RIGHT inferior cerebellum. Electronically Signed   By: Awilda Metro M.D.   On: 05/03/2018 04:00   Dg Chest Port 1 View  Result Date: 05/04/2018 CLINICAL DATA:  Post traumatic pneumothorax on the right with chest tube treatment. Intubated patient. EXAM: PORTABLE CHEST 1 VIEW COMPARISON:  Portable chest x-ray of May 03, 2018 FINDINGS: There remains an approximately 10% right-sided pneumothorax. The pigtail catheter projects over the posterior aspect of the seventh and eighth ribs medially. There are confluent airspace opacities at both bases greatest on the right. The heart is top-normal in size. The pulmonary vascularity is normal. There is no mediastinal shift. The endotracheal tube tip lies 5.1 cm above the carina. The esophagogastric tube tip in proximal port project below the GE junction. There are multiple right  lateral rib fractures. IMPRESSION: Approximately 10% right apical pneumothorax. Bibasilar atelectasis or developing pneumonia greatest on the right. No significant pleural effusion. The support tubes are in reasonable position. Electronically Signed   By: David  SwazilandJordan M.D.   On: 05/04/2018 07:23   Dg Chest Port 1  View  Result Date: 05/03/2018 CLINICAL DATA:  Chest tube placement EXAM: PORTABLE CHEST 1 VIEW COMPARISON:  01/02/2018; 05/01/2018; CT of the chest, abdomen and pelvis-05/01/2018 FINDINGS: Interval placement of right-sided chest tube with resolution of right-sided pneumothorax. Improved aeration of the right lung with persistent consolidative opacities within right upper lung with associated volume loss and mild left to right mediastinal shift. Stable positioning of remaining support apparatus. Left hemithorax remains well aerated. No new focal airspace opacities. Trace right-sided effusion is not excluded. Re-demonstrated multiple minimally displaced right-sided rib fractures. No radiopaque foreign body. IMPRESSION: 1. Interval resolution of right-sided pneumothorax post chest tube placement with improved aeration of the right lung but with partial atelectasis/collapse of the right upper lobe. Stable position of remaining support apparatus. 2. Persistent right upper lobe consolidative opacities favored to represent atelectasis and/or contusion. No new focal airspace opacities. Electronically Signed   By: Simonne ComeJohn  Watts M.D.   On: 05/03/2018 08:58   Dg Chest Port 1 View  Result Date: 05/03/2018 CLINICAL DATA:  Multiple rib fractures.  Intubated. EXAM: PORTABLE CHEST 1 VIEW COMPARISON:  Chest x-ray dated 05/01/2018. Chest CT dated 05/01/2018. FINDINGS: There is now a large RIGHT-sided pneumothorax with collapse of the RIGHT lower lobe. Heart/mediastinal structures remain in the midline. LEFT lung is clear. Endotracheal tube appears adequately positioned with tip approximately 3 cm above the carina. Enteric tube passes below the diaphragm. Multiple RIGHT-sided rib fractures, better demonstrated on earlier CT. IMPRESSION: 1. Large RIGHT-sided pneumothorax, with associated collapse of the RIGHT lower lung. Heart/mediastinal structures remain in the midline. 2. LEFT lung is clear. These results were called by telephone  at the time of interpretation on 05/03/2018 at 8:03 am to Dr. Janee Mornhompson with trauma service, who verbally acknowledged these results. Electronically Signed   By: Bary RichardStan  Maynard M.D.   On: 05/03/2018 08:03    Assessment/Plan: Stable from a neurosurgical perspective.  Okay to wean and extubate from a neurosurgical perspective.  Spoke with patient's parents who were both at the bedside.   Hewitt ShortsNUDELMAN,ROBERT W, MD 05/04/2018, 6:06 PM

## 2018-05-04 NOTE — Progress Notes (Signed)
Pt started to desat around 0130. RN attempted inline suction, no improvement. RT lavaged and suctioned with minimal improvement. RT bagged pt with peep valve, MD made aware and orders to increase Peep on the vent to 12.   Pt back on vent and holding sats. WCTM

## 2018-05-04 NOTE — Progress Notes (Signed)
WUA: propofol stopped from [phone], fent halved from [phone]. Pt began to desat to 88, was double stacking breaths, belly-breathing with poor chest expansion. He raised his left arm and moved his feet, but did not follow commands or open eyes. Sedation resumed at 0846.

## 2018-05-04 NOTE — Progress Notes (Signed)
Follow up - Trauma and Critical Care  Patient Details:    Frederick Vanvranken. is an 32 y.o. male.  Lines/tubes : Airway 7.5 mm (Active)  Secured at (cm) 23 cm 05/04/2018  7:21 AM  Measured From Lips 05/04/2018  7:21 AM  Secured Location Center 05/04/2018  7:21 AM  Secured By [employer] 05/04/2018  7:21 AM  Tube Holder Repositioned Yes 05/04/2018  7:21 AM  Cuff Pressure (cm H2O) 26 cm H2O 05/03/2018  7:38 PM  Site Condition Dry 05/04/2018  7:21 AM     Arterial Line 05/02/18 Radial (Active)  Site Assessment Clean;Dry;Intact 05/03/2018  8:00 PM  Line Status Pulsatile blood flow 05/03/2018  8:00 PM  Art Line Waveform Appropriate 05/03/2018  8:00 PM  Art Line Interventions Zeroed and calibrated 05/03/2018  8:00 PM  Color/Movement/Sensation Capillary refill less than 3 sec 05/03/2018  8:00 PM  Dressing Type Transparent 05/03/2018  8:00 PM  Dressing Status Clean;Dry;Intact 05/03/2018  8:00 PM  Dressing Change Due 05/09/18 05/03/2018  8:00 PM     Chest Tube 1 Lateral;Right Pleural (Active)  Suction -20 cm H2O 05/03/2018  8:00 PM  Chest Tube Air Leak None 05/03/2018  8:00 PM  Patency Intervention Tip/tilt 05/03/2018  8:00 PM  Drainage Description Sanguineous 05/03/2018  8:00 PM  Dressing Status Clean;Dry;Intact 05/03/2018  8:00 PM  Dressing Intervention New dressing 05/03/2018  9:00 AM  Site Assessment Clean;Dry;Intact 05/03/2018  8:00 PM  Surrounding Skin Dry;Intact 05/03/2018  8:00 PM  Output (mL) 90 mL 05/04/2018  6:00 AM     NG/OG Tube Orogastric 18 Fr. Right mouth Aucultation;Xray Measured external length of tube (Active)  Site Assessment Clean;Intact 05/03/2018  8:00 PM  Ongoing Placement Verification No change in respiratory status 05/03/2018  8:00 PM  Status Infusing tube feed 05/03/2018  8:00 PM  Drainage Appearance Bile 05/03/2018  8:00 AM  Output (mL) 60 mL 05/03/2018 11:00 AM     Urethral Catheter EMT R Brown Temperature probe 16 Fr. (Active)  Indication for Insertion or Continuance of Catheter Unstable critical  patients (first 24-48 hours) 05/03/2018  8:00 PM  Site Assessment Clean;Intact;Dry 05/03/2018  8:00 PM  Catheter Maintenance Bag below level of bladder;Catheter secured;Drainage bag/tubing not touching floor;Insertion date on drainage bag;No dependent loops;Seal intact 05/03/2018  8:00 PM  Collection Container Standard drainage bag 05/03/2018  8:00 PM  Securement Method Securing device (Describe) 05/03/2018  8:00 PM  Urinary Catheter Interventions Unclamped 05/02/2018  3:00 AM  Output (mL) 325 mL 05/04/2018  8:00 AM    Microbiology/Sepsis markers: No results found for this or any previous visit.  Anti-infectives:  Anti-infectives (From admission, onward)   Start     Dose/Rate Route Frequency Ordered Stop   05/02/18 0600  ceFAZolin (ANCEF) IVPB 1 g/50 mL premix     1 g 100 mL/hr over 30 Minutes Intravenous Every 8 hours 05/02/18 0317     05/02/18 0055  bacitracin 50,000 Units in sodium chloride 0.9 % 500 mL irrigation  Status:  Discontinued       As needed 05/02/18 0055 05/02/18 0209   05/01/18 2330  ceFAZolin (ANCEF) IVPB 2g/100 mL premix     2 g 200 mL/hr over 30 Minutes Intravenous  Once 05/01/18 2317 05/01/18 2355      Best Practice/Protocols:  VTE Prophylaxis: Mechanical GI Prophylaxis: Proton Pump Inhibitor Continous Sedation  Consults: Treatment Team:  Shirlean Kelly, MD    Events:  Subjective:    Overnight Issues: Spiking fevers  Objective:  Vital signs  for last 24 hours: Temp:  [99.7 F (37.6 C)-104.2 F (40.1 C)] 100.6 F (38.1 C) (06/03 1000) Pulse Rate:  [91-135] 120 (06/03 1000) Resp:  [10-32] 10 (06/03 1000) BP: (93-149)/(54-90) 93/58 (06/03 1000) SpO2:  [88 %-100 %] 99 % (06/03 1000) Arterial Line BP: (90-164)/(49-81) 96/53 (06/03 1000) FiO2 (%):  [40 %-100 %] 100 % (06/03 0900)  Hemodynamic parameters for last 24 hours:    Intake/Output from previous day: 06/02 0701 - 06/03 0700 In: 2405.6 [I.V.:1915.6; NG/GT:240; IV Piggyback:250] Out: 2320  [Urine:1550; Emesis/NG output:60; Chest Tube:710]  Intake/Output this shift: Total I/O In: -  Out: 325 [Urine:325]  Vent settings for last 24 hours: Vent Mode: PRVC FiO2 (%):  [40 %-100 %] 100 % Set Rate:  [14 bmp] 14 bmp Vt Set:  [510 mL] 510 mL PEEP:  [5 cmH20-12 cmH20] 12 cmH20 Plateau Pressure:  [13 cmH20-27 cmH20] 13 cmH20  Physical Exam:  General: no respiratory distress Neuro: RASS -1 and agitated Resp: clear to auscultation bilaterally and CXR shows a small right apical PTX.  CT has been draining well. CVS: regular rate and rhythm, S1, S2 normal, no murmur, click, rub or gallop and some episodes of tachycardia GI: soft, nontender, BS WNL, no r/g, hypoactive BS and tolerating tube feedings well Extremities: no edema, no erythema, pulses WNL  Results for orders placed or performed during the hospital encounter of 05/01/18 (from the past 24 hour(s))  Magnesium     Status: Abnormal   Collection Time: 05/03/18 10:58 AM  Result Value Ref Range   Magnesium 1.6 (L) 1.7 - 2.4 mg/dL  Phosphorus     Status: Abnormal   Collection Time: 05/03/18 10:58 AM  Result Value Ref Range   Phosphorus 1.4 (L) 2.5 - 4.6 mg/dL  CBC with Differential/Platelet     Status: Abnormal   Collection Time: 05/03/18 11:08 AM  Result Value Ref Range   WBC 9.3 4.0 - 10.5 K/uL   RBC 2.63 (L) 4.22 - 5.81 MIL/uL   Hemoglobin 7.7 (L) 13.0 - 17.0 g/dL   HCT 13.023.4 (L) 86.539.0 - 78.452.0 %   MCV 89.0 78.0 - 100.0 fL   MCH 29.3 26.0 - 34.0 pg   MCHC 32.9 30.0 - 36.0 g/dL   RDW 69.612.9 29.511.5 - 28.415.5 %   Platelets 124 (L) 150 - 400 K/uL   Neutrophils Relative % 82 %   Lymphocytes Relative 10 %   Monocytes Relative 8 %   Eosinophils Relative 0 %   Basophils Relative 0 %   Neutro Abs 7.7 1.7 - 7.7 K/uL   Lymphs Abs 0.9 0.7 - 4.0 K/uL   Monocytes Absolute 0.7 0.1 - 1.0 K/uL   Eosinophils Absolute 0.0 0.0 - 0.7 K/uL   Basophils Absolute 0.0 0.0 - 0.1 K/uL   WBC Morphology MILD LEFT SHIFT (1-5% METAS, OCC MYELO, OCC  BANDS)   Basic metabolic panel     Status: Abnormal   Collection Time: 05/03/18 11:08 AM  Result Value Ref Range   Sodium 138 135 - 145 mmol/L   Potassium 3.9 3.5 - 5.1 mmol/L   Chloride 111 101 - 111 mmol/L   CO2 23 22 - 32 mmol/L   Glucose, Bld 113 (H) 65 - 99 mg/dL   BUN 7 6 - 20 mg/dL   Creatinine, Ser 1.320.89 0.61 - 1.24 mg/dL   Calcium 7.1 (L) 8.9 - 10.3 mg/dL   GFR calc non Af Amer >60 >60 mL/min   GFR calc Af Amer >60 >60  mL/min   Anion gap 4 (L) 5 - 15  Magnesium     Status: None   Collection Time: 05/03/18  5:24 PM  Result Value Ref Range   Magnesium 1.7 1.7 - 2.4 mg/dL  Phosphorus     Status: Abnormal   Collection Time: 05/03/18  5:24 PM  Result Value Ref Range   Phosphorus 1.1 (L) 2.5 - 4.6 mg/dL  CBC     Status: Abnormal   Collection Time: 05/03/18  5:24 PM  Result Value Ref Range   WBC 7.2 4.0 - 10.5 K/uL   RBC 2.65 (L) 4.22 - 5.81 MIL/uL   Hemoglobin 7.7 (L) 13.0 - 17.0 g/dL   HCT 14.7 (L) 82.9 - 56.2 %   MCV 88.3 78.0 - 100.0 fL   MCH 29.1 26.0 - 34.0 pg   MCHC 32.9 30.0 - 36.0 g/dL   RDW 13.0 86.5 - 78.4 %   Platelets 122 (L) 150 - 400 K/uL  Glucose, capillary     Status: Abnormal   Collection Time: 05/04/18  3:53 AM  Result Value Ref Range   Glucose-Capillary 114 (H) 65 - 99 mg/dL  Magnesium     Status: Abnormal   Collection Time: 05/04/18  5:17 AM  Result Value Ref Range   Magnesium 1.6 (L) 1.7 - 2.4 mg/dL  Phosphorus     Status: Abnormal   Collection Time: 05/04/18  5:17 AM  Result Value Ref Range   Phosphorus 1.8 (L) 2.5 - 4.6 mg/dL  CBC     Status: Abnormal   Collection Time: 05/04/18  5:17 AM  Result Value Ref Range   WBC 2.6 (L) 4.0 - 10.5 K/uL   RBC 2.57 (L) 4.22 - 5.81 MIL/uL   Hemoglobin 7.6 (L) 13.0 - 17.0 g/dL   HCT 69.6 (L) 29.5 - 28.4 %   MCV 89.9 78.0 - 100.0 fL   MCH 29.6 26.0 - 34.0 pg   MCHC 32.9 30.0 - 36.0 g/dL   RDW 13.2 44.0 - 10.2 %   Platelets 117 (L) 150 - 400 K/uL  Basic metabolic panel     Status: Abnormal    Collection Time: 05/04/18  5:17 AM  Result Value Ref Range   Sodium 140 135 - 145 mmol/L   Potassium 4.0 3.5 - 5.1 mmol/L   Chloride 109 101 - 111 mmol/L   CO2 26 22 - 32 mmol/L   Glucose, Bld 131 (H) 65 - 99 mg/dL   BUN 8 6 - 20 mg/dL   Creatinine, Ser 7.25 0.61 - 1.24 mg/dL   Calcium 7.4 (L) 8.9 - 10.3 mg/dL   GFR calc non Af Amer >60 >60 mL/min   GFR calc Af Amer >60 >60 mL/min   Anion gap 5 5 - 15  Provider-confirm verbal Blood Bank order - RBC, FFP, Type & Screen; 2 Units; Order taken: 05/03/2018; 9:53 PM; Level 1 Trauma, Emergency Release, STAT 2 units of O negative red cells and 2 units of A plasmas emergency released to the ER @ 2158. All ...     Status: None   Collection Time: 05/04/18  7:30 AM  Result Value Ref Range   Blood product order confirm      MD AUTHORIZATION REQUESTED Performed at [facility], [address], [place], [place] [address]   Glucose, capillary     Status: Abnormal   Collection Time: 05/04/18  7:33 AM  Result Value Ref Range   Glucose-Capillary 123 (H) 65 - 99 mg/dL  Comment 1 Notify RN    Comment 2 Document in Chart      Assessment/Plan:   NEURO  Altered Mental Status:  Has had episodes of agitation, but currently is quiet.   Plan: CPM to keep sedated through period of pulmonary disease.  PULM  Atelectasis/collapse (focal and Right lung) Chest Wall Trauma rib fractures and Pneumothorax (traumatic)   Plan: Chest tube in place.  Increase suction to -40  CARDIO  Sinus Tachycardia   Plan: No specific treatment  RENAL  No specific issues   Plan: CPM  GI  Hepatic Trauma   Plan: Tolerating tube feedings.    ID  No known infectious sources, but spiking fevers to 104.  Will cultured, but patient had a badly contaminated open scalp wound.   Plan: COntinue antibiotics and culture tracheal aspirate  HEME  Anemia acute blood loss anemia)   Plan: Hemoglobin stable at 7.6  ENDO No specific problems.   Plan: CPM  Global Issues  Patient  currently is having issues with oxygenation and ventilation.  Will check ABG and adjust ventilator accordingly.  Increase CT suction to -40.  No plans for neuro assessments at this point.    LOS: 2 days   Additional comments:I reviewed the patient's new clinical lab test results. cbc/bmet/abg, I reviewed the patients new imaging test results. cxr and I have discussed and reviewed with family members patient's mother and brother  Critical Care Total Time*: 26 Minutes  Jimmye Norman 05/04/2018  *Care during the described time interval was provided by me and/or other providers on the critical care team.  I have reviewed this patient's available data, including medical history, events of note, physical examination and test results as part of my evaluation.
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
## 2018-05-05 ENCOUNTER — Inpatient Hospital Stay (HOSPITAL_COMMUNITY): Payer: Self-pay

## 2018-05-05 DIAGNOSIS — G9340 Encephalopathy, unspecified: Secondary | ICD-10-CM

## 2018-05-05 DIAGNOSIS — J80 Acute respiratory distress syndrome: Secondary | ICD-10-CM

## 2018-05-05 DIAGNOSIS — S27322S Contusion of lung, bilateral, sequela: Secondary | ICD-10-CM

## 2018-05-05 LAB — POCT I-STAT 3, ART BLOOD GAS (G3+)
ACID-BASE EXCESS: 1 mmol/L (ref 0.0–2.0)
Bicarbonate: 28.9 mmol/L — ABNORMAL HIGH (ref 20.0–28.0)
O2 SAT: 81 %
TCO2: 31 mmol/L (ref 22–32)
pCO2 arterial: 62.7 mmHg — ABNORMAL HIGH (ref 32.0–48.0)
pH, Arterial: 7.272 — ABNORMAL LOW (ref 7.350–7.450)
pO2, Arterial: 53 mmHg — ABNORMAL LOW (ref 83.0–108.0)

## 2018-05-05 LAB — GLUCOSE, CAPILLARY
GLUCOSE-CAPILLARY: 115 mg/dL — AB (ref 65–99)
GLUCOSE-CAPILLARY: 121 mg/dL — AB (ref 65–99)
GLUCOSE-CAPILLARY: 132 mg/dL — AB (ref 65–99)
Glucose-Capillary: 110 mg/dL — ABNORMAL HIGH (ref 65–99)
Glucose-Capillary: 93 mg/dL (ref 65–99)

## 2018-05-05 LAB — CBC WITH DIFFERENTIAL/PLATELET
BASOS ABS: 0 10*3/uL (ref 0.0–0.1)
BASOS ABS: 0 10*3/uL (ref 0.0–0.1)
BASOS PCT: 1 %
BASOS PCT: 0 %
Band Neutrophils: 26 %
Blasts: 0 %
EOS ABS: 0 10*3/uL (ref 0.0–0.7)
EOS PCT: 7 %
Eosinophils Absolute: 0.5 10*3/uL (ref 0.0–0.7)
Eosinophils Relative: 1 %
HCT: 21.8 % — ABNORMAL LOW (ref 39.0–52.0)
HEMATOCRIT: 25.8 % — AB (ref 39.0–52.0)
Hemoglobin: 7.1 g/dL — ABNORMAL LOW (ref 13.0–17.0)
Hemoglobin: 8.3 g/dL — ABNORMAL LOW (ref 13.0–17.0)
LYMPHS ABS: 3.1 10*3/uL (ref 0.7–4.0)
LYMPHS PCT: 13 %
Lymphocytes Relative: 43 %
Lymphs Abs: 0.5 10*3/uL — ABNORMAL LOW (ref 0.7–4.0)
MCH: 29.5 pg (ref 26.0–34.0)
MCH: 28.8 pg (ref 26.0–34.0)
MCHC: 32.6 g/dL (ref 30.0–36.0)
MCHC: 32.2 g/dL (ref 30.0–36.0)
MCV: 90.5 fL (ref 78.0–100.0)
MCV: 89.6 fL (ref 78.0–100.0)
METAMYELOCYTES PCT: 5 %
MONO ABS: 0.2 10*3/uL (ref 0.1–1.0)
MONO ABS: 0.1 10*3/uL (ref 0.1–1.0)
MYELOCYTES: 3 %
Monocytes Relative: 5 %
Monocytes Relative: 2 %
NEUTROS ABS: 3.1 10*3/uL (ref 1.7–7.7)
Neutro Abs: 3.6 10*3/uL (ref 1.7–7.7)
Neutrophils Relative %: 80 %
Neutrophils Relative %: 14 %
Other: 0 %
Platelets: 118 10*3/uL — ABNORMAL LOW (ref 150–400)
Platelets: 148 10*3/uL — ABNORMAL LOW (ref 150–400)
Promyelocytes Relative: 0 %
RBC: 2.41 MIL/uL — ABNORMAL LOW (ref 4.22–5.81)
RBC: 2.88 MIL/uL — ABNORMAL LOW (ref 4.22–5.81)
RDW: 13.2 % (ref 11.5–15.5)
RDW: 13.2 % (ref 11.5–15.5)
Smear Review: ADEQUATE
WBC: 3.8 10*3/uL — ABNORMAL LOW (ref 4.0–10.5)
WBC: 7.3 10*3/uL (ref 4.0–10.5)
nRBC: 0 /100 WBC

## 2018-05-05 LAB — BASIC METABOLIC PANEL
ANION GAP: 8 (ref 5–15)
BUN: 11 mg/dL (ref 6–20)
CALCIUM: 7.3 mg/dL — AB (ref 8.9–10.3)
CO2: 29 mmol/L (ref 22–32)
CREATININE: 0.91 mg/dL (ref 0.61–1.24)
Chloride: 105 mmol/L (ref 101–111)
GLUCOSE: 145 mg/dL — AB (ref 65–99)
Potassium: 4.2 mmol/L (ref 3.5–5.1)
Sodium: 142 mmol/L (ref 135–145)

## 2018-05-05 LAB — TRIGLYCERIDES: TRIGLYCERIDES: 231 mg/dL — AB (ref <150)

## 2018-05-05 LAB — PREPARE RBC (CROSSMATCH)

## 2018-05-05 MED ORDER — SODIUM CHLORIDE 0.9 % IV SOLN
Freq: Once | INTRAVENOUS | Status: AC
  Administered 2018-05-05: 13:00:00 via INTRAVENOUS

## 2018-05-05 MED ORDER — CEFEPIME HCL 2 G IJ SOLR
2.0000 g | Freq: Two times a day (BID) | INTRAMUSCULAR | Status: DC
  Administered 2018-05-05 – 2018-05-07 (×6): 2 g via INTRAVENOUS
  Filled 2018-05-05 (×8): qty 2

## 2018-05-05 MED ORDER — TAMSULOSIN HCL 0.4 MG PO CAPS
0.4000 mg | ORAL_CAPSULE | Freq: Every day | ORAL | Status: DC
  Administered 2018-05-06 – 2018-05-12 (×7): 0.4 mg via ORAL
  Filled 2018-05-05 (×7): qty 1

## 2018-05-05 MED ORDER — PANTOPRAZOLE SODIUM 40 MG PO PACK
40.0000 mg | PACK | Freq: Every day | ORAL | Status: DC
  Administered 2018-05-05 – 2018-06-02 (×29): 40 mg
  Filled 2018-05-05 (×29): qty 20
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
## 2018-05-05 NOTE — Progress Notes (Signed)
Pt desating, after several attempts to suction, RT bagged pt with peep valve. Pt placed back on vent with bite block in place and O2 sats stable WCTM

## 2018-05-05 NOTE — Procedures (Signed)
Chest Tube Insertion Procedure Note  Indications:  Clinically significant Pneumothorax  Pre-operative Diagnosis: Pneumothorax  Post-operative Diagnosis: Pneumothorax  Procedure Details  Informed consent was obtained for the procedure, including sedation.  Risks of lung perforation, hemorrhage, arrhythmia, and adverse drug reaction were discussed.   After sterile skin prep, using standard technique, a 14 French tube was placed in the right mid axillary line above the nipple level using Seldinger technique. Tube hooked up to pleurevac. Sterile dressing applied.  Findings: Rush of air  Estimated Blood Loss:  Minimal         Specimens:  None              Complications:  None; patient tolerated the procedure well.         Disposition: ICU - intubated and critically ill.         Condition: stable  Frederick GelinasBurke Dalton Molesworth, MD, MPH, FACS Trauma: [phone] General Surgery: [phone]

## 2018-05-05 NOTE — Progress Notes (Signed)
Subjective: Patient continues on ventilator via ETT.  Continues to be sedated with propofol and fentanyl.  Objective: Vital signs in last 24 hours: Vitals:   05/05/18 0500 05/05/18 0600 05/05/18 0700 05/05/18 0730  BP: 116/71 114/68 106/64 107/63  Pulse: (!) 120 (!) 112 (!) 104 100  Resp: 17 16 19 15   Temp: (!) 101.3 F (38.5 C)     TempSrc:      SpO2: 100% 100% 100% 100%  Weight: 83.6 kg (184 lb 4.9 oz)     Height:        Intake/Output from previous day: 06/03 0701 - 06/04 0700 In: 4001 [I.V.:3081; NG/GT:720; IV Piggyback:200] Out: 2650 [Urine:2500; Chest Tube:150] Intake/Output this shift: No intake/output data recorded.  Physical Exam: Mild anisocoria, left pupil 2.5 mm, right pupil 3.5 mm, both round and minimally reactive to light.  CBC Recent Labs    05/04/18 0517 05/05/18 0611  WBC 2.6* 3.8*  HGB 7.6* 7.1*  HCT 23.1* 21.8*  PLT 117* 118*   BMET Recent Labs    05/04/18 0517 05/05/18 0611  NA 140 142  K 4.0 4.2  CL 109 105  CO2 26 29  GLUCOSE 131* 145*  BUN 8 11  CREATININE 0.85 0.91  CALCIUM 7.4* 7.3*   ABG    Component Value Date/Time   PHART 7.312 (L) 05/04/2018 1103   PCO2ART 52.2 (H) 05/04/2018 1103   PO2ART 93.0 05/04/2018 1103   HCO3 26.4 05/04/2018 1103   TCO2 28 05/04/2018 1103   ACIDBASEDEF 10.0 (H) 05/02/2018 0040   O2SAT 96.0 05/04/2018 1103    Studies/Results: Dg Chest Port 1 View  Result Date: 05/05/2018 CLINICAL DATA:  Chest trauma.  Chest tube. EXAM: PORTABLE CHEST 1 VIEW COMPARISON:  05/04/2018. FINDINGS: Increasing RIGHT pneumothorax despite thoracostomy tube. This is estimated to be 10% or perhaps greater. Increasing BILATERAL pulmonary opacities are also noted consistent with pneumonia or edema. ET tube good position. Nasogastric tube coiled in stomach. IMPRESSION: Increasing RIGHT pneumothorax.  Findings discussed with trauma PA. Increasing BILATERAL pulmonary opacities. Two sent lines in apparent good position. Electronically  Signed   By: Elsie StainJohn T Curnes M.D.   On: 05/05/2018 07:52   Dg Chest Port 1 View  Result Date: 05/04/2018 CLINICAL DATA:  Post traumatic pneumothorax on the right with chest tube treatment. Intubated patient. EXAM: PORTABLE CHEST 1 VIEW COMPARISON:  Portable chest x-ray of May 03, 2018 FINDINGS: There remains an approximately 10% right-sided pneumothorax. The pigtail catheter projects over the posterior aspect of the seventh and eighth ribs medially. There are confluent airspace opacities at both bases greatest on the right. The heart is top-normal in size. The pulmonary vascularity is normal. There is no mediastinal shift. The endotracheal tube tip lies 5.1 cm above the carina. The esophagogastric tube tip in proximal port project below the GE junction. There are multiple right lateral rib fractures. IMPRESSION: Approximately 10% right apical pneumothorax. Bibasilar atelectasis or developing pneumonia greatest on the right. No significant pleural effusion. The support tubes are in reasonable position. Electronically Signed   By: David  [place]az[place] M.D.   On: 05/04/2018 07:23    Assessment/Plan: Hopefully as pulmonary/respiratory status improved, sedation will be able to be weaned, and patient eventually weaned from ventilator and extubated.  Continue supportive care.  Spoke with patient's mother who was at bedside.   Hewitt Molina,Frederick W, MD 05/05/2018, 8:59 AM

## 2018-05-06 ENCOUNTER — Inpatient Hospital Stay (HOSPITAL_COMMUNITY): Payer: Self-pay

## 2018-05-06 DIAGNOSIS — S27329S Contusion of lung, unspecified, sequela: Secondary | ICD-10-CM

## 2018-05-06 DIAGNOSIS — J9601 Acute respiratory failure with hypoxia: Secondary | ICD-10-CM

## 2018-05-06 LAB — TYPE AND SCREEN
ABO/RH(D): B POS
Antibody Screen: NEGATIVE
UNIT DIVISION: 0

## 2018-05-06 LAB — CBC
HEMATOCRIT: 25.5 % — AB (ref 39.0–52.0)
Hemoglobin: 8.2 g/dL — ABNORMAL LOW (ref 13.0–17.0)
MCH: 29 pg (ref 26.0–34.0)
MCHC: 32.2 g/dL (ref 30.0–36.0)
MCV: 90.1 fL (ref 78.0–100.0)
Platelets: 142 10*3/uL — ABNORMAL LOW (ref 150–400)
RBC: 2.83 MIL/uL — ABNORMAL LOW (ref 4.22–5.81)
RDW: 13.8 % (ref 11.5–15.5)
WBC: 7.1 10*3/uL (ref 4.0–10.5)

## 2018-05-06 LAB — BASIC METABOLIC PANEL
ANION GAP: 8 (ref 5–15)
BUN: 15 mg/dL (ref 6–20)
CALCIUM: 7.4 mg/dL — AB (ref 8.9–10.3)
CO2: 29 mmol/L (ref 22–32)
Chloride: 108 mmol/L (ref 101–111)
Creatinine, Ser: 0.8 mg/dL (ref 0.61–1.24)
GFR calc non Af Amer: >60 mL/min (ref >60)
Glucose, Bld: 118 mg/dL — ABNORMAL HIGH (ref 65–99)
Potassium: 4 mmol/L (ref 3.5–5.1)
Sodium: 145 mmol/L (ref 135–145)

## 2018-05-06 LAB — BLOOD GAS, ARTERIAL
Acid-Base Excess: 6.2 mmol/L — ABNORMAL HIGH (ref 0.0–2.0)
Bicarbonate: 32.3 mmol/L — ABNORMAL HIGH (ref 20.0–28.0)
Drawn by: 511911
FIO2: 80
MECHVT: 440 mL
O2 Saturation: 87.3 %
PEEP: 5 cmH2O
PO2 ART: 53.5 mmHg — AB (ref 83.0–108.0)
Patient temperature: 98.6
RATE: 24 resp/min
pCO2 arterial: 67.6 mmHg (ref 32.0–48.0)
pH, Arterial: 7.302 — ABNORMAL LOW (ref 7.350–7.450)

## 2018-05-06 LAB — GLUCOSE, CAPILLARY
GLUCOSE-CAPILLARY: 112 mg/dL — AB (ref 65–99)
GLUCOSE-CAPILLARY: 113 mg/dL — AB (ref 65–99)
GLUCOSE-CAPILLARY: 139 mg/dL — AB (ref 65–99)
Glucose-Capillary: 107 mg/dL — ABNORMAL HIGH (ref 65–99)
Glucose-Capillary: 129 mg/dL — ABNORMAL HIGH (ref 65–99)
Glucose-Capillary: 125 mg/dL — ABNORMAL HIGH (ref 65–99)

## 2018-05-06 LAB — BPAM RBC
Blood Product Expiration Date: 201906112359
ISSUE DATE / TIME: 201906041014
UNIT TYPE AND RH: 9500

## 2018-05-06 LAB — PHOSPHORUS: PHOSPHORUS: 2 mg/dL — AB (ref 2.5–4.6)

## 2018-05-06 LAB — MAGNESIUM: MAGNESIUM: 2.2 mg/dL (ref 1.7–2.4)

## 2018-05-06 MED ORDER — DEXTROSE 5 % IV SOLN
30.0000 mmol | Freq: Once | INTRAVENOUS | Status: AC
  Administered 2018-05-06: 30 mmol via INTRAVENOUS
  Filled 2018-05-06 (×2): qty 10

## 2018-05-06 MED ORDER — FUROSEMIDE 10 MG/ML IJ SOLN
40.0000 mg | Freq: Once | INTRAMUSCULAR | Status: AC
  Administered 2018-05-06: 40 mg via INTRAVENOUS
  Filled 2018-05-06: qty 4
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
## 2018-05-06 NOTE — Progress Notes (Signed)
eLink Physician-Brief Progress Note Patient Name: Frederick ForesterSteven Scholler Jr. DOB: 1986-03-22 MRN: [id number]   Date of Service  05/06/2018  HPI/Events of Note  Transient desaturation with suctioning. Pt now with sats in the 96 % range. No evidence of dyssynchrony.  Pt with abd distention and diminished bowel sounds.  eICU Interventions  Stat KUB r/o ileus        Migdalia DkOkoronkwo U Ruford Dudzinski 05/06/2018, 8:39 PM

## 2018-05-07 ENCOUNTER — Inpatient Hospital Stay (HOSPITAL_COMMUNITY): Payer: Self-pay

## 2018-05-07 DIAGNOSIS — R918 Other nonspecific abnormal finding of lung field: Secondary | ICD-10-CM

## 2018-05-07 DIAGNOSIS — S0291XD Unspecified fracture of skull, subsequent encounter for fracture with routine healing: Secondary | ICD-10-CM

## 2018-05-07 DIAGNOSIS — J9601 Acute respiratory failure with hypoxia: Secondary | ICD-10-CM

## 2018-05-07 LAB — BLOOD GAS, ARTERIAL
Acid-Base Excess: 12.6 mmol/L — ABNORMAL HIGH (ref 0.0–2.0)
Acid-Base Excess: 12.9 mmol/L — ABNORMAL HIGH (ref 0.0–2.0)
Bicarbonate: 38.2 mmol/L — ABNORMAL HIGH (ref 20.0–28.0)
Bicarbonate: 38.5 mmol/L — ABNORMAL HIGH (ref 20.0–28.0)
DRAWN BY: 414221
Drawn by: 358491
FIO2: 70
FIO2: 100
LHR: 24 {breaths}/min
MECHVT: 440 mL
MECHVT: 440 mL
O2 SAT: 99 %
O2 Saturation: 92.1 %
PCO2 ART: 66.5 mmHg — AB (ref 32.0–48.0)
PEEP: 14 cmH2O
PEEP: 18 cmH2O
PH ART: 7.381 (ref 7.350–7.450)
Patient temperature: 100.4
RATE: 24 resp/min
pCO2 arterial: 70.4 mmHg (ref 32.0–48.0)
pH, Arterial: 7.36 (ref 7.350–7.450)
pO2, Arterial: 65.1 mmHg — ABNORMAL LOW (ref 83.0–108.0)
pO2, Arterial: 166 mmHg — ABNORMAL HIGH (ref 83.0–108.0)

## 2018-05-07 LAB — CULTURE, RESPIRATORY: SPECIAL REQUESTS: NORMAL

## 2018-05-07 LAB — CBC
HCT: 23.9 % — ABNORMAL LOW (ref 39.0–52.0)
HEMOGLOBIN: 7.8 g/dL — AB (ref 13.0–17.0)
MCH: 29.7 pg (ref 26.0–34.0)
MCHC: 32.6 g/dL (ref 30.0–36.0)
MCV: 90.9 fL (ref 78.0–100.0)
Platelets: 129 10*3/uL — ABNORMAL LOW (ref 150–400)
RBC: 2.63 MIL/uL — ABNORMAL LOW (ref 4.22–5.81)
RDW: 14.2 % (ref 11.5–15.5)
WBC: 8.9 10*3/uL (ref 4.0–10.5)

## 2018-05-07 LAB — BASIC METABOLIC PANEL
Anion gap: 8 (ref 5–15)
BUN: 15 mg/dL (ref 6–20)
CALCIUM: 7.7 mg/dL — AB (ref 8.9–10.3)
CHLORIDE: 103 mmol/L (ref 101–111)
CO2: 38 mmol/L — ABNORMAL HIGH (ref 22–32)
CREATININE: 0.82 mg/dL (ref 0.61–1.24)
GFR calc non Af Amer: >60 mL/min (ref >60)
Glucose, Bld: 140 mg/dL — ABNORMAL HIGH (ref 65–99)
Potassium: 3.8 mmol/L (ref 3.5–5.1)
SODIUM: 149 mmol/L — AB (ref 135–145)

## 2018-05-07 LAB — PATHOLOGIST SMEAR REVIEW

## 2018-05-07 LAB — GLUCOSE, CAPILLARY
GLUCOSE-CAPILLARY: 118 mg/dL — AB (ref 65–99)
GLUCOSE-CAPILLARY: 135 mg/dL — AB (ref 65–99)
Glucose-Capillary: 137 mg/dL — ABNORMAL HIGH (ref 65–99)
Glucose-Capillary: 130 mg/dL — ABNORMAL HIGH (ref 65–99)
Glucose-Capillary: 140 mg/dL — ABNORMAL HIGH (ref 65–99)
Glucose-Capillary: 129 mg/dL — ABNORMAL HIGH (ref 65–99)
Glucose-Capillary: 122 mg/dL — ABNORMAL HIGH (ref 65–99)

## 2018-05-07 LAB — CULTURE, RESPIRATORY W GRAM STAIN: Culture: NORMAL

## 2018-05-07 LAB — MAGNESIUM: Magnesium: 2.5 mg/dL — ABNORMAL HIGH (ref 1.7–2.4)

## 2018-05-07 LAB — PHOSPHORUS: PHOSPHORUS: 2.3 mg/dL — AB (ref 2.5–4.6)

## 2018-05-07 NOTE — Procedures (Signed)
Bronchoscopy Procedure Note Frederick ForesterSteven Giza Molina. [id number] September 26, 1986  Procedure: Bronchoscopy Indications: Diagnostic evaluation of the airways, Obtain specimens for culture and/or other diagnostic studies and Remove secretions  Procedure Details Consent: Risks of procedure as well as the alternatives and risks of each were explained to the (patient/caregiver).  Consent for procedure obtained. Time Out: Verified patient identification, verified procedure, site/side was marked, verified correct patient position, special equipment/implants available, medications/allergies/relevent history reviewed, required imaging and test results available.  Performed  In preparation for procedure, patient was given 100% FiO2 and bronchoscope lubricated. Sedation: Benzodiazepines and Fentanyl  Airway entered and the following bronchi were examined: RUL, RML, RLL, LUL, LLL and Bronchi.   Bronchus intermedius completely blocked by purulent and blood secretions Bronchoscope removed.  , Patient placed back on 100% FiO2 at conclusion of procedure.    Evaluation Hemodynamic Status: BP stable throughout; O2 sats: stable throughout Patient's Current Condition: stable Specimens:  Sent purulent fluid Complications: No apparent complications Patient did tolerate procedure well.   Frederick Molina,Frederick Molina 05/07/2018
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
## 2018-05-08 ENCOUNTER — Inpatient Hospital Stay (HOSPITAL_COMMUNITY): Payer: Self-pay

## 2018-05-08 LAB — BASIC METABOLIC PANEL
ANION GAP: 8 (ref 5–15)
BUN: 23 mg/dL — ABNORMAL HIGH (ref 6–20)
CO2: 35 mmol/L — ABNORMAL HIGH (ref 22–32)
CREATININE: 0.73 mg/dL (ref 0.61–1.24)
Calcium: 7.6 mg/dL — ABNORMAL LOW (ref 8.9–10.3)
Chloride: 108 mmol/L (ref 101–111)
GLUCOSE: 121 mg/dL — AB (ref 65–99)
Potassium: 3.8 mmol/L (ref 3.5–5.1)
Sodium: 151 mmol/L — ABNORMAL HIGH (ref 135–145)

## 2018-05-08 LAB — TRIGLYCERIDES: Triglycerides: 323 mg/dL — ABNORMAL HIGH (ref <150)

## 2018-05-08 LAB — GLUCOSE, CAPILLARY
GLUCOSE-CAPILLARY: 122 mg/dL — AB (ref 65–99)
Glucose-Capillary: 116 mg/dL — ABNORMAL HIGH (ref 65–99)
Glucose-Capillary: 110 mg/dL — ABNORMAL HIGH (ref 65–99)
Glucose-Capillary: 138 mg/dL — ABNORMAL HIGH (ref 65–99)
Glucose-Capillary: 122 mg/dL — ABNORMAL HIGH (ref 65–99)

## 2018-05-08 LAB — MAGNESIUM: MAGNESIUM: 2.5 mg/dL — AB (ref 1.7–2.4)

## 2018-05-08 LAB — PHOSPHORUS: PHOSPHORUS: 2.2 mg/dL — AB (ref 2.5–4.6)

## 2018-05-08 MED ORDER — FUROSEMIDE 10 MG/ML IJ SOLN
40.0000 mg | Freq: Two times a day (BID) | INTRAMUSCULAR | Status: AC
  Administered 2018-05-08 (×2): 40 mg via INTRAVENOUS
  Filled 2018-05-08 (×2): qty 4

## 2018-05-08 MED ORDER — SODIUM CHLORIDE 0.9 % IV SOLN
2.0000 g | Freq: Three times a day (TID) | INTRAVENOUS | Status: DC
  Administered 2018-05-08 – 2018-05-13 (×16): 2 g via INTRAVENOUS
  Filled 2018-05-08 (×17): qty 2

## 2018-05-08 MED ORDER — VANCOMYCIN HCL 10 G IV SOLR
1750.0000 mg | Freq: Once | INTRAVENOUS | Status: AC
  Administered 2018-05-08: 1750 mg via INTRAVENOUS
  Filled 2018-05-08: qty 1750

## 2018-05-08 MED ORDER — BISACODYL 10 MG RE SUPP
10.0000 mg | Freq: Every day | RECTAL | Status: DC | PRN
  Administered 2018-05-09: 10 mg via RECTAL
  Filled 2018-05-08: qty 1

## 2018-05-08 MED ORDER — VANCOMYCIN HCL IN DEXTROSE 1-5 GM/200ML-% IV SOLN
1000.0000 mg | Freq: Three times a day (TID) | INTRAVENOUS | Status: DC
  Administered 2018-05-08 – 2018-05-09 (×3): 1000 mg via INTRAVENOUS
  Filled 2018-05-08 (×4): qty 200
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
## 2018-05-08 NOTE — Progress Notes (Signed)
Subjective: Patient remains intubated and sedated  Objective: Vital signs in last 24 hours: Temp:  [99.5 F (37.5 C)-100.3 F (37.9 C)] 99.6 F (37.6 C) (06/07 0444) Pulse Rate:  [92-134] 118 (06/07 0600) Resp:  [21-36] 21 (06/07 0600) BP: (117-162)/(70-125) 122/73 (06/07 0600) SpO2:  [93 %-100 %] 100 % (06/07 0600) FiO2 (%):  [70 %-100 %] 100 % (06/07 0401) Weight:  [91 kg (200 lb 9.9 oz)] 91 kg (200 lb 9.9 oz) (06/07 0500)  Intake/Output from previous day: 06/06 0701 - 06/07 0700 In: 3343 [I.V.:2278; NG/GT:765; IV Piggyback:300] Out: 2202 [Urine:2010; Chest Tube:192] Intake/Output this shift: No intake/output data recorded.  Wound: CDI  Lab Results: Lab Results  Component Value Date   WBC 8.9 05/07/2018   HGB 7.8 (L) 05/07/2018   HCT 23.9 (L) 05/07/2018   MCV 90.9 05/07/2018   PLT 129 (L) 05/07/2018   Lab Results  Component Value Date   INR 1.21 05/01/2018   BMET Lab Results  Component Value Date   NA 149 (H) 05/07/2018   K 3.8 05/07/2018   CL 103 05/07/2018   CO2 38 (H) 05/07/2018   GLUCOSE 140 (H) 05/07/2018   BUN 15 05/07/2018   CREATININE 0.82 05/07/2018   CALCIUM 7.7 (L) 05/07/2018    Studies/Results: Dg Abd 1 View  Result Date: 05/06/2018 CLINICAL DATA:  Ileus. EXAM: ABDOMEN - 1 VIEW COMPARISON:  CT abdomen pelvis May 01, 2018 FINDINGS: The bowel gas pattern is normal. Small volume retained large bowel stool. Nasogastric tube tip projects over proximal stomach. Temperature probe projects in the pelvis. No radio-opaque calculi or other significant radiographic abnormality are seen. Phleboliths project in the pelvis. IMPRESSION: Nonspecific bowel gas pattern. Electronically Signed   By: Awilda Metroourtnay  Bloomer M.D.   On: 05/06/2018 21:11   Dg Chest Port 1 View  Result Date: 05/07/2018 CLINICAL DATA:  Intubation, chest tubes EXAM: PORTABLE CHEST 1 VIEW COMPARISON:  Portable exam 0625 hours compared to 05/06/2018 FINDINGS: Tip of endotracheal tube projects 4.8 cm  above carina. Nasogastric tube extends into stomach. Two pigtail RIGHT thoracostomy tubes are present. Upper normal heart size. Mediastinal contours and pulmonary vascularity normal. Airspace infiltrates identified throughout RIGHT lung and LEFT lower lobe. No pneumothorax. Lateral RIGHT rib fractures again seen. IMPRESSION: Persistent pulmonary infiltrates bilaterally greater on RIGHT. Electronically Signed   By: Ulyses SouthwardMark  Boles M.D.   On: 05/07/2018 09:21    Assessment/Plan: stable  Estimated body mass index is 32.38 kg/m as calculated from the following:   Height as of this encounter: 5\' 6"  (1.676 m).   Weight as of this encounter: 91 kg (200 lb 9.9 oz).    LOS: 6 days    Laren Whaling S 05/08/2018, 7:49 AM
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
## 2018-05-08 NOTE — Progress Notes (Signed)
RN removed the bite block from pts mouth and noticed a pressure injury (stg 1) on pts lower lip from this device. Will continue to monitor

## 2018-05-09 ENCOUNTER — Inpatient Hospital Stay (HOSPITAL_COMMUNITY): Payer: Self-pay

## 2018-05-09 DIAGNOSIS — L8990 Pressure ulcer of unspecified site, unspecified stage: Secondary | ICD-10-CM

## 2018-05-09 LAB — CBC
HEMATOCRIT: 23.2 % — AB (ref 39.0–52.0)
Hemoglobin: 7.3 g/dL — ABNORMAL LOW (ref 13.0–17.0)
MCH: 29.1 pg (ref 26.0–34.0)
MCHC: 31.5 g/dL (ref 30.0–36.0)
MCV: 92.4 fL (ref 78.0–100.0)
PLATELETS: 105 10*3/uL — AB (ref 150–400)
RBC: 2.51 MIL/uL — ABNORMAL LOW (ref 4.22–5.81)
RDW: 14.2 % (ref 11.5–15.5)
WBC: 12.7 10*3/uL — ABNORMAL HIGH (ref 4.0–10.5)

## 2018-05-09 LAB — BASIC METABOLIC PANEL
Anion gap: 9 (ref 5–15)
BUN: 27 mg/dL — AB (ref 6–20)
CO2: 43 mmol/L — AB (ref 22–32)
Calcium: 7.9 mg/dL — ABNORMAL LOW (ref 8.9–10.3)
Chloride: 98 mmol/L — ABNORMAL LOW (ref 101–111)
Creatinine, Ser: 0.85 mg/dL (ref 0.61–1.24)
GFR calc Af Amer: >60 mL/min (ref >60)
GLUCOSE: 142 mg/dL — AB (ref 65–99)
POTASSIUM: 3.2 mmol/L — AB (ref 3.5–5.1)
Sodium: 150 mmol/L — ABNORMAL HIGH (ref 135–145)

## 2018-05-09 LAB — MAGNESIUM: Magnesium: 2.4 mg/dL (ref 1.7–2.4)

## 2018-05-09 LAB — URINE CULTURE: Culture: NO GROWTH

## 2018-05-09 LAB — GLUCOSE, CAPILLARY
GLUCOSE-CAPILLARY: 117 mg/dL — AB (ref 65–99)
GLUCOSE-CAPILLARY: 140 mg/dL — AB (ref 65–99)
GLUCOSE-CAPILLARY: 100 mg/dL — AB (ref 65–99)
Glucose-Capillary: 116 mg/dL — ABNORMAL HIGH (ref 65–99)
Glucose-Capillary: 118 mg/dL — ABNORMAL HIGH (ref 65–99)
Glucose-Capillary: 122 mg/dL — ABNORMAL HIGH (ref 65–99)
Glucose-Capillary: 127 mg/dL — ABNORMAL HIGH (ref 65–99)

## 2018-05-09 LAB — PHOSPHORUS: Phosphorus: 2.9 mg/dL (ref 2.5–4.6)

## 2018-05-09 MED ORDER — MIDAZOLAM HCL 2 MG/2ML IJ SOLN: 2.0000 mg | INTRAMUSCULAR | Status: DC | PRN

## 2018-05-09 MED ORDER — MIDAZOLAM HCL 2 MG/2ML IJ SOLN
2.0000 mg | INTRAMUSCULAR | Status: DC | PRN
  Administered 2018-05-09: 4 mg via INTRAVENOUS
  Administered 2018-05-09: 2 mg via INTRAVENOUS
  Administered 2018-05-09: 4 mg via INTRAVENOUS
  Administered 2018-05-09 (×2): 2 mg via INTRAVENOUS
  Filled 2018-05-09 (×3): qty 4
  Filled 2018-05-09: qty 2

## 2018-05-09 MED ORDER — VECURONIUM BROMIDE 10 MG IV SOLR
10.0000 mg | Freq: Once | INTRAVENOUS | Status: AC
  Administered 2018-05-09: 10 mg via INTRAVENOUS

## 2018-05-09 MED ORDER — VECURONIUM BROMIDE 10 MG IV SOLR
INTRAVENOUS | Status: AC
  Filled 2018-05-09: qty 10

## 2018-05-09 MED ORDER — SODIUM CHLORIDE 0.9 % IV SOLN
0.0000 mg/h | INTRAVENOUS | Status: DC
  Administered 2018-05-09: 0.5 mg/h via INTRAVENOUS
  Administered 2018-05-10: 4 mg/h via INTRAVENOUS
  Administered 2018-05-11 – 2018-05-12 (×3): 5 mg/h via INTRAVENOUS
  Filled 2018-05-09 (×3): qty 20
  Filled 2018-05-09: qty 10
  Filled 2018-05-09: qty 20

## 2018-05-09 MED ORDER — POTASSIUM CHLORIDE 20 MEQ/15ML (10%) PO SOLN
40.0000 meq | ORAL | Status: AC
  Administered 2018-05-09 (×2): 40 meq
  Filled 2018-05-09 (×2): qty 30
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
## 2018-05-09 NOTE — Progress Notes (Signed)
eLink Physician-Brief Progress Note Patient Name: Frederick ForesterSteven Doland Jr. DOB: 1986/04/15 MRN: [id number]   Date of Service  05/09/2018  HPI/Events of Note  Hypokalemia  eICU Interventions  Potassium replaced     Intervention Category Intermediate Interventions: Electrolyte abnormality - evaluation and management  DETERDING,ELIZABETH 05/09/2018, 4:39 AM
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
## 2018-05-09 NOTE — Progress Notes (Signed)
RN flushed CT 1 and CT 2 with 30ml sterile normal saline each.

## 2018-05-09 NOTE — Progress Notes (Signed)
RN flushed CT 1 and CT 2 with 30cc sterile normal saline each

## 2018-05-10 ENCOUNTER — Inpatient Hospital Stay (HOSPITAL_COMMUNITY): Payer: Self-pay

## 2018-05-10 ENCOUNTER — Inpatient Hospital Stay: Payer: Self-pay

## 2018-05-10 ENCOUNTER — Other Ambulatory Visit: Payer: Self-pay

## 2018-05-10 DIAGNOSIS — J189 Pneumonia, unspecified organism: Secondary | ICD-10-CM

## 2018-05-10 LAB — BASIC METABOLIC PANEL
Anion gap: 14 (ref 5–15)
BUN: 29 mg/dL — AB (ref 6–20)
CALCIUM: 6.8 mg/dL — AB (ref 8.9–10.3)
CO2: 27 mmol/L (ref 22–32)
Chloride: 103 mmol/L (ref 101–111)
Creatinine, Ser: 0.66 mg/dL (ref 0.61–1.24)
GFR calc Af Amer: >60 mL/min (ref >60)
GLUCOSE: 94 mg/dL (ref 65–99)
Potassium: 3.4 mmol/L — ABNORMAL LOW (ref 3.5–5.1)
Sodium: 144 mmol/L (ref 135–145)

## 2018-05-10 LAB — CULTURE, BAL-QUANTITATIVE W GRAM STAIN: Culture: 20000 — AB

## 2018-05-10 LAB — GLUCOSE, CAPILLARY
GLUCOSE-CAPILLARY: 131 mg/dL — AB (ref 65–99)
GLUCOSE-CAPILLARY: 117 mg/dL — AB (ref 65–99)
GLUCOSE-CAPILLARY: 123 mg/dL — AB (ref 65–99)
Glucose-Capillary: 100 mg/dL — ABNORMAL HIGH (ref 65–99)
Glucose-Capillary: 134 mg/dL — ABNORMAL HIGH (ref 65–99)
Glucose-Capillary: 122 mg/dL — ABNORMAL HIGH (ref 65–99)

## 2018-05-10 MED ORDER — FUROSEMIDE 10 MG/ML IJ SOLN
40.0000 mg | Freq: Two times a day (BID) | INTRAMUSCULAR | Status: AC
  Administered 2018-05-10 (×2): 40 mg via INTRAVENOUS
  Filled 2018-05-10 (×2): qty 4

## 2018-05-10 MED ORDER — POTASSIUM CHLORIDE 20 MEQ PO PACK
40.0000 meq | PACK | Freq: Once | ORAL | Status: AC
Start: 2018-05-10 — End: 2018-05-10
  Administered 2018-05-10: 40 meq via ORAL
  Filled 2018-05-10: qty 2

## 2018-05-10 NOTE — Progress Notes (Signed)
RN flushed CT 1 and CT 2 with 30cc sterile normal saline per order.
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
## 2018-05-10 NOTE — Progress Notes (Signed)
Spoke with RN re PICC to be placed 05/10/2018.  States PIV access is working well.  Consent on chart.
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
## 2018-05-10 NOTE — Progress Notes (Signed)
RN noted patient desating in the 70s on monitor. RN and RT at bedside. Had to bag patient with little improvement in O2 sats. E-Link notified and orders for a one time dose of vecuronium 10mg . Patient is now stable on the monitor.

## 2018-05-11 ENCOUNTER — Inpatient Hospital Stay (HOSPITAL_COMMUNITY): Payer: Self-pay

## 2018-05-11 DIAGNOSIS — J80 Acute respiratory distress syndrome: Secondary | ICD-10-CM

## 2018-05-11 DIAGNOSIS — J690 Pneumonitis due to inhalation of food and vomit: Secondary | ICD-10-CM

## 2018-05-11 LAB — BASIC METABOLIC PANEL
ANION GAP: 12 (ref 5–15)
Anion gap: 10 (ref 5–15)
Anion gap: 9 (ref 5–15)
BUN: 32 mg/dL — ABNORMAL HIGH (ref 6–20)
BUN: 30 mg/dL — ABNORMAL HIGH (ref 6–20)
BUN: 31 mg/dL — ABNORMAL HIGH (ref 6–20)
CALCIUM: 7.7 mg/dL — AB (ref 8.9–10.3)
CALCIUM: 7.9 mg/dL — AB (ref 8.9–10.3)
CALCIUM: 7.6 mg/dL — AB (ref 8.9–10.3)
CO2: 37 mmol/L — ABNORMAL HIGH (ref 22–32)
CO2: 39 mmol/L — ABNORMAL HIGH (ref 22–32)
CO2: 39 mmol/L — ABNORMAL HIGH (ref 22–32)
CREATININE: 0.76 mg/dL (ref 0.61–1.24)
CREATININE: 0.8 mg/dL (ref 0.61–1.24)
Chloride: 104 mmol/L (ref 101–111)
Chloride: 108 mmol/L (ref 101–111)
Chloride: 102 mmol/L (ref 101–111)
Creatinine, Ser: 0.88 mg/dL (ref 0.61–1.24)
GFR calc Af Amer: >60 mL/min (ref >60)
GFR calc non Af Amer: >60 mL/min (ref >60)
GFR calc non Af Amer: >60 mL/min (ref >60)
GFR calc non Af Amer: >60 mL/min (ref >60)
GLUCOSE: 119 mg/dL — AB (ref 65–99)
Glucose, Bld: 127 mg/dL — ABNORMAL HIGH (ref 65–99)
Glucose, Bld: 180 mg/dL — ABNORMAL HIGH (ref 65–99)
POTASSIUM: 3.7 mmol/L (ref 3.5–5.1)
Potassium: 3.3 mmol/L — ABNORMAL LOW (ref 3.5–5.1)
Potassium: 3.6 mmol/L (ref 3.5–5.1)
SODIUM: 153 mmol/L — AB (ref 135–145)
SODIUM: 157 mmol/L — AB (ref 135–145)
SODIUM: 150 mmol/L — AB (ref 135–145)

## 2018-05-11 LAB — GLUCOSE, CAPILLARY
GLUCOSE-CAPILLARY: 123 mg/dL — AB (ref 65–99)
GLUCOSE-CAPILLARY: 160 mg/dL — AB (ref 65–99)
GLUCOSE-CAPILLARY: 173 mg/dL — AB (ref 65–99)
Glucose-Capillary: 115 mg/dL — ABNORMAL HIGH (ref 65–99)
Glucose-Capillary: 122 mg/dL — ABNORMAL HIGH (ref 65–99)
Glucose-Capillary: 123 mg/dL — ABNORMAL HIGH (ref 65–99)

## 2018-05-11 LAB — CBC
HCT: 22.3 % — ABNORMAL LOW (ref 39.0–52.0)
Hemoglobin: 7 g/dL — ABNORMAL LOW (ref 13.0–17.0)
MCH: 28.6 pg (ref 26.0–34.0)
MCHC: 31.4 g/dL (ref 30.0–36.0)
MCV: 91 fL (ref 78.0–100.0)
PLATELETS: 121 10*3/uL — AB (ref 150–400)
RBC: 2.45 MIL/uL — AB (ref 4.22–5.81)
RDW: 14.4 % (ref 11.5–15.5)
WBC: 15.7 10*3/uL — AB (ref 4.0–10.5)

## 2018-05-11 LAB — TRIGLYCERIDES: TRIGLYCERIDES: 534 mg/dL — AB (ref <150)

## 2018-05-11 LAB — PHOSPHORUS: Phosphorus: 2.9 mg/dL (ref 2.5–4.6)

## 2018-05-11 LAB — PROCALCITONIN: Procalcitonin: 1.43 ng/mL

## 2018-05-11 LAB — MAGNESIUM: MAGNESIUM: 2.6 mg/dL — AB (ref 1.7–2.4)

## 2018-05-11 MED ORDER — POTASSIUM CHLORIDE 20 MEQ/15ML (10%) PO SOLN
40.0000 meq | Freq: Once | ORAL | Status: AC
  Administered 2018-05-11: 40 meq
  Filled 2018-05-11: qty 30

## 2018-05-11 MED ORDER — PIVOT 1.5 CAL PO LIQD
1000.0000 mL | ORAL | Status: DC
  Administered 2018-05-11 – 2018-05-20 (×11): 1000 mL
  Filled 2018-05-11 (×10): qty 1000

## 2018-05-11 MED ORDER — SODIUM CHLORIDE 0.9 % IV SOLN
3.0000 ug/kg/min | INTRAVENOUS | Status: DC
  Administered 2018-05-11: 9.5 ug/kg/min via INTRAVENOUS
  Administered 2018-05-11: 3 ug/kg/min via INTRAVENOUS
  Administered 2018-05-12 (×4): 9 ug/kg/min via INTRAVENOUS
  Filled 2018-05-11 (×9): qty 20

## 2018-05-11 MED ORDER — ARTIFICIAL TEARS OPHTHALMIC OINT
1.0000 "application " | TOPICAL_OINTMENT | Freq: Three times a day (TID) | OPHTHALMIC | Status: DC
  Administered 2018-05-11 – 2018-05-17 (×18): 1 via OPHTHALMIC
  Filled 2018-05-11: qty 3.5

## 2018-05-11 MED ORDER — FREE WATER
200.0000 mL | Freq: Four times a day (QID) | Status: DC
  Administered 2018-05-11 – 2018-05-24 (×53): 200 mL

## 2018-05-11 MED ORDER — CISATRACURIUM BOLUS VIA INFUSION
5.0000 mg | Freq: Once | INTRAVENOUS | Status: AC
  Administered 2018-05-11: 5 mg via INTRAVENOUS
  Filled 2018-05-11: qty 5

## 2018-05-11 MED ORDER — CHLORHEXIDINE GLUCONATE CLOTH 2 % EX PADS
6.0000 | MEDICATED_PAD | Freq: Every day | CUTANEOUS | Status: DC
  Administered 2018-05-12 – 2018-05-20 (×9): 6 via TOPICAL

## 2018-05-11 MED ORDER — DEXTROSE 5 % IV SOLN
INTRAVENOUS | Status: DC
  Administered 2018-05-11 – 2018-05-22 (×6): via INTRAVENOUS

## 2018-05-11 MED ORDER — SODIUM CHLORIDE 3 % IN NEBU
4.0000 mL | INHALATION_SOLUTION | Freq: Two times a day (BID) | RESPIRATORY_TRACT | Status: AC
  Administered 2018-05-11 – 2018-05-14 (×6): 4 mL via RESPIRATORY_TRACT
  Filled 2018-05-11 (×6): qty 4

## 2018-05-11 MED ORDER — MIDAZOLAM HCL 2 MG/2ML IJ SOLN: 2.0000 mg | Freq: Once | INTRAMUSCULAR | Status: DC | PRN

## 2018-05-11 MED ORDER — FUROSEMIDE 10 MG/ML IJ SOLN
40.0000 mg | Freq: Two times a day (BID) | INTRAMUSCULAR | Status: AC
  Administered 2018-05-11 (×2): 40 mg via INTRAVENOUS
  Filled 2018-05-11 (×2): qty 4

## 2018-05-11 MED ORDER — SODIUM CHLORIDE 0.9% FLUSH: 10.0000 mL | INTRAVENOUS | Status: DC | PRN

## 2018-05-11 MED ORDER — CHLORHEXIDINE GLUCONATE CLOTH 2 % EX PADS: 6.0000 | MEDICATED_PAD | Freq: Every day | CUTANEOUS | Status: DC

## 2018-05-11 MED ORDER — ALBUTEROL SULFATE (2.5 MG/3ML) 0.083% IN NEBU
2.5000 mg | INHALATION_SOLUTION | RESPIRATORY_TRACT | Status: DC
  Administered 2018-05-11 – 2018-05-18 (×40): 2.5 mg via RESPIRATORY_TRACT
  Filled 2018-05-11 (×40): qty 3

## 2018-05-11 MED ORDER — GUAIFENESIN 100 MG/5ML PO SOLN
15.0000 mL | ORAL | Status: DC
  Administered 2018-05-11 – 2018-05-16 (×29): 300 mg
  Administered 2018-05-16: 200 mg
  Administered 2018-05-16 – 2018-05-18 (×11): 300 mg
  Filled 2018-05-11 (×2): qty 15
  Filled 2018-05-11: qty 10
  Filled 2018-05-11 (×16): qty 15
  Filled 2018-05-11: qty 5
  Filled 2018-05-11 (×21): qty 15

## 2018-05-11 MED ORDER — FENTANYL CITRATE (PF) 100 MCG/2ML IJ SOLN: 100.0000 ug | Freq: Once | INTRAMUSCULAR | Status: DC | PRN

## 2018-05-11 MED ORDER — SODIUM CHLORIDE 0.9% FLUSH
10.0000 mL | Freq: Two times a day (BID) | INTRAVENOUS | Status: DC
  Administered 2018-05-11: 20 mL
  Administered 2018-05-11: 10 mL
  Administered 2018-05-12: 20 mL
  Administered 2018-05-12 – 2018-05-13 (×2): 30 mL
  Administered 2018-05-13: 20 mL
  Administered 2018-05-14 – 2018-05-15 (×4): 10 mL
  Administered 2018-05-16: 20 mL
  Administered 2018-05-16: 10 mL
  Administered 2018-05-17: 20 mL
  Administered 2018-05-17: 10 mL
  Administered 2018-05-18: 20 mL
  Administered 2018-05-18 – 2018-05-21 (×4): 10 mL
  Administered 2018-05-22: 20 mL
  Administered 2018-05-23 – 2018-05-29 (×3): 10 mL

## 2018-05-11 MED ORDER — DEXMEDETOMIDINE HCL IN NACL 400 MCG/100ML IV SOLN
0.0000 ug/kg/h | INTRAVENOUS | Status: AC
  Administered 2018-05-11 (×2): 0.9 ug/kg/h via INTRAVENOUS
  Administered 2018-05-11: 0.4 ug/kg/h via INTRAVENOUS
  Administered 2018-05-12 (×4): 0.9 ug/kg/h via INTRAVENOUS
  Administered 2018-05-13: 1.2 ug/kg/h via INTRAVENOUS
  Administered 2018-05-13: 0.9 ug/kg/h via INTRAVENOUS
  Administered 2018-05-13: 1.2 ug/kg/h via INTRAVENOUS
  Administered 2018-05-13: 0.8 ug/kg/h via INTRAVENOUS
  Administered 2018-05-13: 0.901 ug/kg/h via INTRAVENOUS
  Administered 2018-05-14 (×3): 1 ug/kg/h via INTRAVENOUS
  Filled 2018-05-11 (×17): qty 100
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
## 2018-05-11 NOTE — Progress Notes (Addendum)
Trauma Service Note  Subjective: We appreciate the assistance from the CCM service.  Patient is sedated but not paralyzed.   Objective: Vital signs in last 24 hours: Temp:  [99.6 F (37.6 C)-101.3 F (38.5 C)] 100.1 F (37.8 C) (06/10 0400) Pulse Rate:  [95-130] 112 (06/10 1121) Resp:  [19-38] 36 (06/10 1121) BP: (98-169)/(53-93) 136/82 (06/10 1121) SpO2:  [90 %-100 %] 90 % (06/10 1121) FiO2 (%):  [60 %-80 %] 80 % (06/10 0800) Weight:  [85.7 kg (188 lb 15 oz)] 85.7 kg (188 lb 15 oz) (06/10 0400) Last BM Date: 05/10/18  Intake/Output from previous day: 06/09 0701 - 06/10 0700 In: 3389.3 [I.V.:1949.3; NG/GT:1140; IV Piggyback:300] Out: 1610 [Urine:5135; Chest Tube:80] Intake/Output this shift: Total I/O In: 545.9 [I.V.:432.6; NG/GT:113.3] Out: 350 [Urine:350]  General: Getting breathing treatment with internal "percussion"  Lungs: Difficult to auscultate breath sounds.  Lungs fully expanded with CT x 2 in place,  Minimal drainage.  Abd: Soft, good bowel sounds and tolerating tube feedings at goal rate  Extremities: No changes  Neuro: Sedated heavily  Lab Results: CBC  Recent Labs    05/09/18 0307 05/11/18 0225  WBC 12.7* 15.7*  HGB 7.3* 7.0*  HCT 23.2* 22.3*  PLT 105* 121*   BMET Recent Labs    05/10/18 1050 05/11/18 0225  NA 144 153*  K 3.4* 3.7  CL 103 104  CO2 27 37*  GLUCOSE 94 119*  BUN 29* 32*  CREATININE 0.66 0.88  CALCIUM 6.8* 7.7*   PT/INR No results for input(s): LABPROT, INR in the last 72 hours. ABG No results for input(s): PHART, HCO3 in the last 72 hours.  Invalid input(s): PCO2, PO2  Studies/Results: Dg Chest Port 1 View  Result Date: 05/11/2018 CLINICAL DATA:  Acute respiratory failure. EXAM: PORTABLE CHEST 1 VIEW COMPARISON:  05/10/2018 FINDINGS: There are 2 right-sided chest tubes in place. No pneumothorax identified. ET tube tip is above the carina. The nasogastric tube tip is below the field of view. The side port is  projecting over the central abdomen just below the expected level of the GE junction. Normal heart size. Bilateral airspace opacities are unchanged from prior exam. IMPRESSION: 1. Bilateral airspace opacities are identified, unchanged from previous exam. 2. Stable support apparatus. 3. Right chest tubes in place without pneumothorax. Electronically Signed   By: Signa Kell M.D.   On: 05/11/2018 08:51   Dg Chest Port 1 View  Result Date: 05/10/2018 CLINICAL DATA:  Adult respiratory distress syndrome. Endotracheal tube present. EXAM: PORTABLE CHEST 1 VIEW COMPARISON:  05/09/2018 FINDINGS: Endotracheal tube and nasogastric tube remain in appropriate position. 2 right pleural pigtail catheters remain in place. No pneumothorax visualized. Bilateral lower lung airspace disease shows no significant interval change. Multiple right rib fractures again demonstrated. IMPRESSION: Bilateral lower lung airspace disease, without significant change. Multiple right rib fractures again seen. No pneumothorax visualized. Electronically Signed   By: Myles Rosenthal M.D.   On: 05/10/2018 13:20   [place] Ekg Site Rite  Result Date: 05/10/2018 If Site Rite image not attached, placement could not be confirmed due to current cardiac rhythm.   Anti-infectives: Anti-infectives (From admission, onward)   Start     Dose/Rate Route Frequency Ordered Stop   05/08/18 1700  vancomycin (VANCOCIN) IVPB 1000 mg/200 mL premix  Status:  Discontinued     1,000 mg 200 mL/hr over 60 Minutes Intravenous Every 8 hours 05/08/18 0802 05/09/18 1412   05/08/18 0815  ceFEPIme (MAXIPIME) 2 g in sodium chloride 0.9 %  100 mL IVPB     2 g 200 mL/hr over 30 Minutes Intravenous Every 8 hours 05/08/18 0803     05/08/18 0800  vancomycin (VANCOCIN) 1,750 mg in sodium chloride 0.9 % 500 mL IVPB     1,750 mg 250 mL/hr over 120 Minutes Intravenous  Once 05/08/18 0802 05/08/18 1200   05/05/18 0900  ceFEPIme (MAXIPIME) 2 g in sodium chloride 0.9 % 100 mL IVPB   Status:  Discontinued     2 g 200 mL/hr over 30 Minutes Intravenous Every 12 hours 05/05/18 0755 05/08/18 0803   05/02/18 0600  ceFAZolin (ANCEF) IVPB 1 g/50 mL premix  Status:  Discontinued     1 g 100 mL/hr over 30 Minutes Intravenous Every 8 hours 05/02/18 0317 05/05/18 0755   05/02/18 0055  bacitracin 50,000 Units in sodium chloride 0.9 % 500 mL irrigation  Status:  Discontinued       As needed 05/02/18 0055 05/02/18 0209   05/01/18 2330  ceFAZolin (ANCEF) IVPB 2g/100 mL premix     2 g 200 mL/hr over 30 Minutes Intravenous  Once 05/01/18 2317 05/01/18 2355      Assessment/Plan: s/p Procedure(s): CRANIECTOMY AND REPAIR OF SCALP LACERATIONS ARDS management per CCM.  Doing okay.  Clearly not ready for extubation or weaning. Hypernatremia new.  Getting D5W.  Will start free water per tube also.  LOS: 9 days   Marta LamasJames O. Gae BonWyatt, III, MD, FACS [phone] Trauma Surgeon 05/11/2018

## 2018-05-11 NOTE — Progress Notes (Signed)
Trauma MD aware of rising sodium levels. Free water flushes ordered and D5W ordered continuous infusion. Frederick Molina, [place] ScrapeSarah E, RN
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
## 2018-05-11 NOTE — Progress Notes (Signed)
Patient on highest doses of Versed, Fentanyl, and Precedex and still stacking on the ventilator. O2 sats in upper 90's on 80% FiO2. Trauma MD as well as CCM notified. Orders for chemical paralytic Nimbex ordered. Waiting for medication from pharmacy.

## 2018-05-11 NOTE — Progress Notes (Signed)
RN starting paralytic - Metaneb not used for neb treatment at this time.
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
## 2018-05-11 NOTE — Progress Notes (Signed)
Peripherally Inserted Central Catheter/Midline Placement  The IV Nurse has discussed with the patient and/or persons authorized to consent for the patient, the purpose of this procedure and the potential benefits and risks involved with this procedure.  The benefits include less needle sticks, lab draws from the catheter, and the patient may be discharged home with the catheter. Risks include, but not limited to, infection, bleeding, blood clot (thrombus formation), and puncture of an artery; nerve damage and irregular heartbeat and possibility to perform a PICC exchange if needed/ordered by physician.  Alternatives to this procedure were also discussed.  Bard Power PICC patient education guide, fact sheet on infection prevention and patient information card has been provided to patient /or left at bedside.    PICC/Midline Placement Documentation  PICC Double Lumen 05/11/18 PICC Right Brachial 44 cm 0 cm (Active)  Indication for Insertion or Continuance of Line Prolonged intravenous therapies 05/11/2018  9:30 AM  Exposed Catheter (cm) 0 cm 05/11/2018  9:30 AM  Site Assessment Clean;Dry;Intact 05/11/2018  9:30 AM  Lumen #1 Status Flushed;Blood return noted 05/11/2018  9:30 AM  Lumen #2 Status Flushed;Blood return noted 05/11/2018  9:30 AM  Dressing Type Transparent 05/11/2018  9:30 AM  Dressing Status Clean;Dry;Intact;Antimicrobial disc in place 05/11/2018  9:30 AM  Dressing Intervention New dressing 05/11/2018  9:30 AM  Dressing Change Due 05/18/18 05/11/2018  9:30 AM   Consent signed by mother   Frederick Molina, Frederick Molina 05/11/2018, 9:31 AM

## 2018-05-12 ENCOUNTER — Inpatient Hospital Stay (HOSPITAL_COMMUNITY): Payer: Self-pay

## 2018-05-12 LAB — POCT I-STAT 3, ART BLOOD GAS (G3+)
Acid-Base Excess: 9 mmol/L — ABNORMAL HIGH (ref 0.0–2.0)
BICARBONATE: 37.3 mmol/L — AB (ref 20.0–28.0)
O2 Saturation: 95 %
PO2 ART: 89 mmHg (ref 83.0–108.0)
TCO2: 39 mmol/L — AB (ref 22–32)
pCO2 arterial: 71.3 mmHg (ref 32.0–48.0)
pH, Arterial: 7.328 — ABNORMAL LOW (ref 7.350–7.450)

## 2018-05-12 LAB — CBC WITH DIFFERENTIAL/PLATELET
Basophils Absolute: 0 10*3/uL (ref 0.0–0.1)
Basophils Relative: 0 %
EOS PCT: 1 %
Eosinophils Absolute: 0.2 10*3/uL (ref 0.0–0.7)
HEMATOCRIT: 20.2 % — AB (ref 39.0–52.0)
Hemoglobin: 6.3 g/dL — CL (ref 13.0–17.0)
LYMPHS ABS: 1.3 10*3/uL (ref 0.7–4.0)
Lymphocytes Relative: 7 %
MCH: 28.9 pg (ref 26.0–34.0)
MCHC: 31.2 g/dL (ref 30.0–36.0)
MCV: 92.7 fL (ref 78.0–100.0)
MONOS PCT: 7 %
Monocytes Absolute: 1.3 10*3/uL — ABNORMAL HIGH (ref 0.1–1.0)
NEUTROS ABS: 16.2 10*3/uL — AB (ref 1.7–7.7)
Neutrophils Relative %: 85 %
Platelets: 97 10*3/uL — ABNORMAL LOW (ref 150–400)
RBC: 2.18 MIL/uL — ABNORMAL LOW (ref 4.22–5.81)
RDW: 14.6 % (ref 11.5–15.5)
WBC: 19 10*3/uL — ABNORMAL HIGH (ref 4.0–10.5)

## 2018-05-12 LAB — BASIC METABOLIC PANEL
Anion gap: 5 (ref 5–15)
Anion gap: 9 (ref 5–15)
BUN: 30 mg/dL — AB (ref 6–20)
BUN: 32 mg/dL — AB (ref 6–20)
CHLORIDE: 104 mmol/L (ref 101–111)
CHLORIDE: 101 mmol/L (ref 101–111)
CO2: 36 mmol/L — AB (ref 22–32)
CO2: 37 mmol/L — AB (ref 22–32)
CREATININE: 0.81 mg/dL (ref 0.61–1.24)
Calcium: 7 mg/dL — ABNORMAL LOW (ref 8.9–10.3)
Calcium: 7.2 mg/dL — ABNORMAL LOW (ref 8.9–10.3)
Creatinine, Ser: 0.73 mg/dL (ref 0.61–1.24)
GFR calc Af Amer: >60 mL/min (ref >60)
GFR calc non Af Amer: >60 mL/min (ref >60)
GFR calc non Af Amer: >60 mL/min (ref >60)
GLUCOSE: 150 mg/dL — AB (ref 65–99)
Glucose, Bld: 149 mg/dL — ABNORMAL HIGH (ref 65–99)
POTASSIUM: 3.7 mmol/L (ref 3.5–5.1)
POTASSIUM: 3.9 mmol/L (ref 3.5–5.1)
Sodium: 145 mmol/L (ref 135–145)
Sodium: 147 mmol/L — ABNORMAL HIGH (ref 135–145)

## 2018-05-12 LAB — GLUCOSE, CAPILLARY
Glucose-Capillary: 130 mg/dL — ABNORMAL HIGH (ref 65–99)
Glucose-Capillary: 139 mg/dL — ABNORMAL HIGH (ref 65–99)
Glucose-Capillary: 142 mg/dL — ABNORMAL HIGH (ref 65–99)
Glucose-Capillary: 145 mg/dL — ABNORMAL HIGH (ref 65–99)
Glucose-Capillary: 158 mg/dL — ABNORMAL HIGH (ref 65–99)
Glucose-Capillary: 159 mg/dL — ABNORMAL HIGH (ref 65–99)

## 2018-05-12 LAB — CBC
HCT: 24.2 % — ABNORMAL LOW (ref 39.0–52.0)
Hemoglobin: 7.6 g/dL — ABNORMAL LOW (ref 13.0–17.0)
MCH: 28 pg (ref 26.0–34.0)
MCHC: 31.4 g/dL (ref 30.0–36.0)
MCV: 89.3 fL (ref 78.0–100.0)
PLATELETS: 94 10*3/uL — AB (ref 150–400)
RBC: 2.71 MIL/uL — ABNORMAL LOW (ref 4.22–5.81)
RDW: 16.2 % — AB (ref 11.5–15.5)
WBC: 15.9 10*3/uL — AB (ref 4.0–10.5)

## 2018-05-12 LAB — PREPARE RBC (CROSSMATCH)

## 2018-05-12 MED ORDER — FUROSEMIDE 10 MG/ML IJ SOLN
40.0000 mg | Freq: Once | INTRAMUSCULAR | Status: AC
  Administered 2018-05-12: 40 mg via INTRAVENOUS
  Filled 2018-05-12: qty 4

## 2018-05-12 MED ORDER — FOLIC ACID 1 MG PO TABS
1.0000 mg | ORAL_TABLET | Freq: Every day | ORAL | Status: DC
  Administered 2018-05-13 – 2018-06-03 (×22): 1 mg
  Filled 2018-05-12 (×24): qty 1

## 2018-05-12 MED ORDER — POTASSIUM CHLORIDE 20 MEQ/15ML (10%) PO SOLN
40.0000 meq | Freq: Once | ORAL | Status: AC
  Administered 2018-05-12: 40 meq via ORAL
  Filled 2018-05-12: qty 30

## 2018-05-12 MED ORDER — VITAMIN B-1 100 MG PO TABS
100.0000 mg | ORAL_TABLET | Freq: Every day | ORAL | Status: DC
  Administered 2018-05-13 – 2018-06-04 (×23): 100 mg
  Filled 2018-05-12 (×23): qty 1

## 2018-05-12 MED ORDER — SODIUM CHLORIDE 0.9 % IV SOLN
Freq: Once | INTRAVENOUS | Status: AC
  Administered 2018-05-30: 1 mL via INTRAVENOUS

## 2018-05-12 MED ORDER — FOLIC ACID 1 MG PO TABS
1.0000 mg | ORAL_TABLET | Freq: Every day | ORAL | Status: DC
  Administered 2018-05-12: 1 mg via ORAL
  Filled 2018-05-12: qty 1

## 2018-05-12 MED ORDER — THIAMINE HCL 100 MG/ML IJ SOLN
100.0000 mg | Freq: Every day | INTRAMUSCULAR | Status: DC
  Administered 2018-05-12: 100 mg via INTRAVENOUS
  Filled 2018-05-12: qty 2

## 2018-05-12 NOTE — Progress Notes (Signed)
Trauma Service Note  Subjective: Patient is paralyzed ans sedated.  I spoke with the patient's mother this AM and explained situation.  Objective: Vital signs in last 24 hours: Temp:  [98 F (36.7 C)-98.9 F (37.2 C)] 98 F (36.7 C) (06/11 0400) Pulse Rate:  [92-118] 100 (06/11 0700) Resp:  [18-36] 24 (06/11 0700) BP: (105-169)/(44-92) 118/73 (06/11 0700) SpO2:  [91 %-100 %] 98 % (06/11 0700) FiO2 (%):  [60 %-100 %] 80 % (06/11 0503) Weight:  [87.3 kg (192 lb 7.4 oz)] 87.3 kg (192 lb 7.4 oz) (06/11 0500) Last BM Date: 05/10/18  Intake/Output from previous day: 06/10 0701 - 06/11 0700 In: 6506.2 [I.V.:4299.3; NG/GT:1906.9; IV Piggyback:300] Out: 1610 [Urine:5045; Chest Tube:140] Intake/Output this shift: No intake/output data recorded.  General: Paralyzed and sedated, but went up and down with ventilator settings throughout the night.  Lungs: No rales, rhonchi, or wheezes.  CXR shows improvement of the atelectasis and consolidation of the LLL.  Abd: Benign and getting tube feedings and tolerating them well apparently.  Extremities: No changes  Neuro: Sedated and paralyzed.  Pupils are reactive   Lab Results: CBC  Recent Labs    05/11/18 0225 05/12/18 0426  WBC 15.7* 19.0*  HGB 7.0* 6.3*  HCT 22.3* 20.2*  PLT 121* 97*   BMET Recent Labs    05/11/18 1807 05/12/18 0426  NA 150* 145  K 3.6 3.7  CL 102 104  CO2 39* 36*  GLUCOSE 180* 150*  BUN 31* 30*  CREATININE 0.80 0.73  CALCIUM 7.6* 7.0*   PT/INR No results for input(s): LABPROT, INR in the last 72 hours. ABG No results for input(s): PHART, HCO3 in the last 72 hours.  Invalid input(s): PCO2, PO2  Studies/Results: Dg Chest Port 1 View  Result Date: 05/11/2018 CLINICAL DATA:  Acute respiratory failure. EXAM: PORTABLE CHEST 1 VIEW COMPARISON:  05/10/2018 FINDINGS: There are 2 right-sided chest tubes in place. No pneumothorax identified. ET tube tip is above the carina. The nasogastric tube tip is below  the field of view. The side port is projecting over the central abdomen just below the expected level of the GE junction. Normal heart size. Bilateral airspace opacities are unchanged from prior exam. IMPRESSION: 1. Bilateral airspace opacities are identified, unchanged from previous exam. 2. Stable support apparatus. 3. Right chest tubes in place without pneumothorax. Electronically Signed   By: Signa Kell M.D.   On: 05/11/2018 08:51   Dg Chest Port 1 View  Result Date: 05/10/2018 CLINICAL DATA:  Adult respiratory distress syndrome. Endotracheal tube present. EXAM: PORTABLE CHEST 1 VIEW COMPARISON:  05/09/2018 FINDINGS: Endotracheal tube and nasogastric tube remain in appropriate position. 2 right pleural pigtail catheters remain in place. No pneumothorax visualized. Bilateral lower lung airspace disease shows no significant interval change. Multiple right rib fractures again demonstrated. IMPRESSION: Bilateral lower lung airspace disease, without significant change. Multiple right rib fractures again seen. No pneumothorax visualized. Electronically Signed   By: Myles Rosenthal M.D.   On: 05/10/2018 13:20   [place] Ekg Site Rite  Result Date: 05/10/2018 If Site Rite image not attached, placement could not be confirmed due to current cardiac rhythm.   Anti-infectives: Anti-infectives (From admission, onward)   Start     Dose/Rate Route Frequency Ordered Stop   05/08/18 1700  vancomycin (VANCOCIN) IVPB 1000 mg/200 mL premix  Status:  Discontinued     1,000 mg 200 mL/hr over 60 Minutes Intravenous Every 8 hours 05/08/18 0802 05/09/18 1412   05/08/18 0815  ceFEPIme (MAXIPIME) 2 g in sodium chloride 0.9 % 100 mL IVPB     2 g 200 mL/hr over 30 Minutes Intravenous Every 8 hours 05/08/18 0803     05/08/18 0800  vancomycin (VANCOCIN) 1,750 mg in sodium chloride 0.9 % 500 mL IVPB     1,750 mg 250 mL/hr over 120 Minutes Intravenous  Once 05/08/18 0802 05/08/18 1200   05/05/18 0900  ceFEPIme (MAXIPIME) 2 g in  sodium chloride 0.9 % 100 mL IVPB  Status:  Discontinued     2 g 200 mL/hr over 30 Minutes Intravenous Every 12 hours 05/05/18 0755 05/08/18 0803   05/02/18 0600  ceFAZolin (ANCEF) IVPB 1 g/50 mL premix  Status:  Discontinued     1 g 100 mL/hr over 30 Minutes Intravenous Every 8 hours 05/02/18 0317 05/05/18 0755   05/02/18 0055  bacitracin 50,000 Units in sodium chloride 0.9 % 500 mL irrigation  Status:  Discontinued       As needed 05/02/18 0055 05/02/18 0209   05/01/18 2330  ceFAZolin (ANCEF) IVPB 2g/100 mL premix     2 g 200 mL/hr over 30 Minutes Intravenous  Once 05/01/18 2317 05/01/18 2355      Assessment/Plan: s/p Procedure(s): CRANIECTOMY AND REPAIR OF SCALP LACERATIONS ARDS with radiological improvement.  ABG pending.  Anemia and getting blood.  Per CCM also.  I do not believe that we can stop the paralytic yet.    We appreciate the CCM support Lasix after transfusion.  LOS: 10 days   Marta LamasJames O. Gae BonWyatt, III, MD, FACS [phone] Trauma Surgeon 05/12/2018

## 2018-05-12 NOTE — Progress Notes (Signed)
SLP Cancellation Note  Patient Details Name: Frederick ForesterSteven Kolle Jr. MRN: [id number] DOB: Apr 26, 1986   Cancelled treatment:       Reason Eval/Treat Not Completed: Medical issues which prohibited therapy; SLP will follow along for readiness.   Frederick Molina, Frederick Molina 05/12/2018, 9:14 AM

## 2018-05-12 NOTE — Progress Notes (Signed)
CRITICAL VALUE ALERT  Critical Value: Hgb 6.3  Date & Time Notied: 05/12/2018  Provider Notified: On-call trauma MD  Orders Received/Actions taken: RN informed to type/screen and transfuse 2 units of PRBC's. Will complete orders as received.  Francia GreavesSavannah R Reubin Bushnell, RN

## 2018-05-12 NOTE — Progress Notes (Signed)
OT Cancellation Note  Patient Details Name: Frederick ForesterSteven Feimster Jr. MRN: [id number] DOB: 25-Apr-1986   Cancelled Treatment:    Reason Eval/Treat Not Completed: Patient not medically ready.   Pt currently sedated and on vent.  Will try back.  Frederick Molina, OTR/L [phone]  Jeani HawkingConarpe, [name] 05/12/2018, 11:16 AM

## 2018-05-12 NOTE — Progress Notes (Signed)
Critical ABG results reported to Dr. Lindie SpruceWyatt and Dr. Minette HeadlandAljishi. No changes in settings per Dr. Minette HeadlandAljishi because ABG is compensated.

## 2018-05-12 NOTE — Progress Notes (Signed)
eLink Physician-Brief Progress Note Patient Name: Frederick ForesterSteven Rape Jr. DOB: 08-09-1986 MRN: [id number]   Date of Service  05/12/2018  HPI/Events of Note  Nursing reports decreased BS at R base.   eICU Interventions  Will order portable CXR STAT.     Intervention Category Major Interventions: Hypoxemia - evaluation and management  Abigail Marsiglia,Joncarlo Eugene 05/12/2018, 8:40 PM

## 2018-05-12 NOTE — Progress Notes (Signed)
Recruitment maneuver performed starting at 2328, for a total of 2 minutes and ending at 2330. Blood pressure was monitored throughout starting at 149/82 and ending at 137/82. The heart rate increased from 96 to 106. Saturation levels stayed at 92-95 throughout the two minutes.
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
## 2018-05-12 NOTE — Progress Notes (Signed)
PT Cancellation Note  Patient Details Name: Frederick ForesterSteven Morandi Jr. MRN: [id number] DOB: 09-09-1986   Cancelled Treatment:    Reason Eval/Treat Not Completed: Patient not medically ready.  Pt ventilated on paralytics.  Will check on tomorrow and see as appropriate. 05/12/2018  Frederick Molina, PT [phone]  (pager)   Frederick Molina 05/12/2018, 10:35 AM

## 2018-05-12 NOTE — Progress Notes (Signed)
eLink Physician-Brief Progress Note Patient Name: Frederick ForesterSteven Caetano Jr. DOB: 1985-12-23 MRN: [id number]   Date of Service  05/12/2018  HPI/Events of Note  Hypoxia - CXR with R base atelectasis. No pneumothorax. Already on PEEP = 14.  eICU Interventions  Will order: 1. Respiratory therapy to do lung recruitment maneuver Q 4 hours.      Intervention Category Major Interventions: Hypoxemia - evaluation and management  Lenell AntuSommer,Mack Eugene 05/12/2018, 9:23 PM

## 2018-05-12 NOTE — Progress Notes (Signed)
Upon initial assessment, RN and RRT discussed inability to auscultate R lower lobe lung sounds. Diminished on yesterdays shift when we were both assigned to the pt and today there were no lung sounds auscultated. Notified on-call CCMD via eLink and orders were placed for a stat portable CXR. Informed to notify provider upon completion of CXR.  Will continue to monitor.  Francia GreavesSavannah R Annais Crafts, RN

## 2018-05-13 ENCOUNTER — Inpatient Hospital Stay (HOSPITAL_COMMUNITY): Payer: Self-pay

## 2018-05-13 DIAGNOSIS — J939 Pneumothorax, unspecified: Secondary | ICD-10-CM

## 2018-05-13 LAB — CULTURE, BLOOD (ROUTINE X 2)
CULTURE: NO GROWTH
CULTURE: NO GROWTH
SPECIAL REQUESTS: ADEQUATE
Special Requests: ADEQUATE

## 2018-05-13 LAB — COMPREHENSIVE METABOLIC PANEL
ALBUMIN: 1.6 g/dL — AB (ref 3.5–5.0)
ALT: 57 U/L (ref 17–63)
ANION GAP: 11 (ref 5–15)
AST: 66 U/L — ABNORMAL HIGH (ref 15–41)
Alkaline Phosphatase: 97 U/L (ref 38–126)
BILIRUBIN TOTAL: 0.7 mg/dL (ref 0.3–1.2)
BUN: 31 mg/dL — ABNORMAL HIGH (ref 6–20)
CO2: 33 mmol/L — AB (ref 22–32)
Calcium: 7 mg/dL — ABNORMAL LOW (ref 8.9–10.3)
Chloride: 98 mmol/L — ABNORMAL LOW (ref 101–111)
Creatinine, Ser: 0.71 mg/dL (ref 0.61–1.24)
GFR calc Af Amer: >60 mL/min (ref >60)
GFR calc non Af Amer: >60 mL/min (ref >60)
GLUCOSE: 163 mg/dL — AB (ref 65–99)
POTASSIUM: 3.4 mmol/L — AB (ref 3.5–5.1)
SODIUM: 142 mmol/L (ref 135–145)
TOTAL PROTEIN: 5.4 g/dL — AB (ref 6.5–8.1)

## 2018-05-13 LAB — CBC WITH DIFFERENTIAL/PLATELET
BASOS ABS: 0 10*3/uL (ref 0.0–0.1)
Basophils Relative: 0 %
EOS ABS: 0.2 10*3/uL (ref 0.0–0.7)
Eosinophils Relative: 1 %
HCT: 27.6 % — ABNORMAL LOW (ref 39.0–52.0)
Hemoglobin: 8.6 g/dL — ABNORMAL LOW (ref 13.0–17.0)
LYMPHS ABS: 1.9 10*3/uL (ref 0.7–4.0)
Lymphocytes Relative: 10 %
MCH: 27.9 pg (ref 26.0–34.0)
MCHC: 31.2 g/dL (ref 30.0–36.0)
MCV: 89.6 fL (ref 78.0–100.0)
MONO ABS: 1.3 10*3/uL — AB (ref 0.1–1.0)
Monocytes Relative: 7 %
Neutro Abs: 15.6 10*3/uL — ABNORMAL HIGH (ref 1.7–7.7)
Neutrophils Relative %: 82 %
PLATELETS: 117 10*3/uL — AB (ref 150–400)
RBC: 3.08 MIL/uL — AB (ref 4.22–5.81)
RDW: 16.4 % — AB (ref 11.5–15.5)
WBC: 19 10*3/uL — AB (ref 4.0–10.5)

## 2018-05-13 LAB — GLUCOSE, CAPILLARY
GLUCOSE-CAPILLARY: 142 mg/dL — AB (ref 65–99)
Glucose-Capillary: 124 mg/dL — ABNORMAL HIGH (ref 65–99)
Glucose-Capillary: 126 mg/dL — ABNORMAL HIGH (ref 65–99)
Glucose-Capillary: 133 mg/dL — ABNORMAL HIGH (ref 65–99)
Glucose-Capillary: 152 mg/dL — ABNORMAL HIGH (ref 65–99)
Glucose-Capillary: 162 mg/dL — ABNORMAL HIGH (ref 65–99)

## 2018-05-13 LAB — POCT I-STAT 3, ART BLOOD GAS (G3+)
ACID-BASE EXCESS: 12 mmol/L — AB (ref 0.0–2.0)
BICARBONATE: 38.2 mmol/L — AB (ref 20.0–28.0)
O2 SAT: 91 %
Patient temperature: 99.5
TCO2: 40 mmol/L — AB (ref 22–32)
pCO2 arterial: 61.1 mmHg — ABNORMAL HIGH (ref 32.0–48.0)
pH, Arterial: 7.407 (ref 7.350–7.450)
pO2, Arterial: 65 mmHg — ABNORMAL LOW (ref 83.0–108.0)

## 2018-05-13 LAB — OCCULT BLOOD X 1 CARD TO LAB, STOOL: FECAL OCCULT BLD: NEGATIVE

## 2018-05-13 MED ORDER — CISATRACURIUM BOLUS VIA INFUSION
30.0000 mg | Freq: Once | INTRAVENOUS | Status: AC
  Administered 2018-05-13: 30 mg via INTRAVENOUS
  Filled 2018-05-13: qty 30

## 2018-05-13 MED ORDER — FENTANYL 2500MCG IN NS 250ML (10MCG/ML) PREMIX INFUSION: 100.0000 ug/h | INTRAVENOUS | Status: DC

## 2018-05-13 MED ORDER — FUROSEMIDE 10 MG/ML IJ SOLN
40.0000 mg | Freq: Once | INTRAMUSCULAR | Status: AC
  Administered 2018-05-13: 40 mg via INTRAVENOUS
  Filled 2018-05-13: qty 4

## 2018-05-13 MED ORDER — MIDAZOLAM HCL 2 MG/2ML IJ SOLN: 2.0000 mg | Freq: Once | INTRAMUSCULAR | Status: DC | PRN

## 2018-05-13 MED ORDER — SODIUM CHLORIDE 0.9 % IV SOLN
1.0000 g | Freq: Three times a day (TID) | INTRAVENOUS | Status: DC
  Administered 2018-05-13 – 2018-05-21 (×25): 1 g via INTRAVENOUS
  Filled 2018-05-13 (×26): qty 1

## 2018-05-13 MED ORDER — SODIUM CHLORIDE 0.9 % IV SOLN
3.0000 ug/kg/min | INTRAVENOUS | Status: DC
  Administered 2018-05-13: 10 ug/kg/min via INTRAVENOUS
  Administered 2018-05-13: 9 ug/kg/min via INTRAVENOUS
  Administered 2018-05-13 – 2018-05-14 (×3): 10 ug/kg/min via INTRAVENOUS
  Administered 2018-05-14: 9 ug/kg/min via INTRAVENOUS
  Administered 2018-05-14: 10 ug/kg/min via INTRAVENOUS
  Administered 2018-05-14: 9 ug/kg/min via INTRAVENOUS
  Administered 2018-05-14: 10 ug/kg/min via INTRAVENOUS
  Administered 2018-05-15 – 2018-05-17 (×10): 9 ug/kg/min via INTRAVENOUS
  Filled 2018-05-13 (×15): qty 20
  Filled 2018-05-13: qty 100
  Filled 2018-05-13 (×6): qty 20

## 2018-05-13 MED ORDER — SODIUM CHLORIDE 0.9 % IV SOLN
2.0000 mg/h | INTRAVENOUS | Status: DC
  Administered 2018-05-13: 8 mg/h via INTRAVENOUS
  Administered 2018-05-13: 10 mg/h via INTRAVENOUS
  Administered 2018-05-13: 9 mg/h via INTRAVENOUS
  Administered 2018-05-14: 6 mg/h via INTRAVENOUS
  Administered 2018-05-14: 9 mg/h via INTRAVENOUS
  Administered 2018-05-14: 8 mg/h via INTRAVENOUS
  Administered 2018-05-15 (×2): 9 mg/h via INTRAVENOUS
  Administered 2018-05-15: 8 mg/h via INTRAVENOUS
  Administered 2018-05-15: 9 mg/h via INTRAVENOUS
  Administered 2018-05-16 (×3): 8 mg/h via INTRAVENOUS
  Administered 2018-05-17: 7 mg/h via INTRAVENOUS
  Administered 2018-05-17 (×2): 9 mg/h via INTRAVENOUS
  Filled 2018-05-13 (×17): qty 10

## 2018-05-13 MED ORDER — POTASSIUM CHLORIDE 20 MEQ/15ML (10%) PO SOLN
40.0000 meq | ORAL | Status: AC
  Administered 2018-05-13 (×3): 40 meq via ORAL
  Filled 2018-05-13 (×3): qty 30

## 2018-05-13 MED ORDER — VANCOMYCIN HCL IN DEXTROSE 1-5 GM/200ML-% IV SOLN
1000.0000 mg | Freq: Three times a day (TID) | INTRAVENOUS | Status: DC
  Administered 2018-05-13 – 2018-05-14 (×2): 1000 mg via INTRAVENOUS
  Filled 2018-05-13 (×3): qty 200

## 2018-05-13 MED ORDER — FUROSEMIDE 10 MG/ML IJ SOLN
40.0000 mg | Freq: Four times a day (QID) | INTRAMUSCULAR | Status: AC
  Administered 2018-05-13 – 2018-05-14 (×4): 40 mg via INTRAVENOUS
  Filled 2018-05-13 (×4): qty 4

## 2018-05-13 MED ORDER — FENTANYL BOLUS VIA INFUSION: 50.0000 ug | INTRAVENOUS | Status: DC | PRN

## 2018-05-13 MED ORDER — MIDAZOLAM HCL 2 MG/2ML IJ SOLN
2.0000 mg | Freq: Once | INTRAMUSCULAR | Status: AC
  Administered 2018-05-13: 2 mg via INTRAVENOUS
  Filled 2018-05-13: qty 2

## 2018-05-13 MED ORDER — FENTANYL CITRATE (PF) 100 MCG/2ML IJ SOLN: 100.0000 ug | Freq: Once | INTRAMUSCULAR | Status: DC | PRN

## 2018-05-13 MED ORDER — ALBUTEROL SULFATE (2.5 MG/3ML) 0.083% IN NEBU
2.5000 mg | INHALATION_SOLUTION | RESPIRATORY_TRACT | Status: DC | PRN
  Administered 2018-05-13 – 2018-05-23 (×2): 2.5 mg via RESPIRATORY_TRACT
  Filled 2018-05-13 (×2): qty 3

## 2018-05-13 MED ORDER — VANCOMYCIN HCL 10 G IV SOLR
1500.0000 mg | Freq: Once | INTRAVENOUS | Status: AC
  Administered 2018-05-13: 1500 mg via INTRAVENOUS
  Filled 2018-05-13: qty 1500

## 2018-05-13 MED ORDER — FENTANYL CITRATE (PF) 100 MCG/2ML IJ SOLN: 100.0000 ug | Freq: Once | INTRAMUSCULAR | Status: DC

## 2018-05-13 MED ORDER — ARTIFICIAL TEARS OPHTHALMIC OINT: 1.0000 "application " | TOPICAL_OINTMENT | Freq: Three times a day (TID) | OPHTHALMIC | Status: DC

## 2018-05-13 MED ORDER — MIDAZOLAM BOLUS VIA INFUSION
2.0000 mg | INTRAVENOUS | Status: DC | PRN
  Administered 2018-05-16 (×2): 2 mg via INTRAVENOUS
  Filled 2018-05-13: qty 2

## 2018-05-13 NOTE — Progress Notes (Signed)
Recruitment maneuver performed starting at 0315 for a total of 2 minutes and ending at 0317. Blood pressure and vitals were monitored throughout. BP at 0315 was 175/100 with a HR of 91 and a sat of 91% (On 16 of PEEP and 100% O2) and at 0317 BP was 160/72 with a HR of 120 and a sat of 96%.

## 2018-05-13 NOTE — Progress Notes (Signed)
Pharmacy Antibiotic Note  Frederick ForesterSteven Perrier Jr. is a 32 y.o. male admitted on 05/01/2018 with pneumonia.  Pharmacy has been consulted for Vancomycin and Meropenem dosing.  Plan: Vancomycin 1500 mg IV x 1, then 1000 mg IV q8hr Meropenem 1000 mg IV q8hr  Height: 5\' 6"  (167.6 cm) Weight: 193 lb 12.6 oz (87.9 kg) IBW/kg (Calculated) : 63.8  Temp (24hrs), Avg:99.2 F (37.3 C), Min:97.4 F (36.3 C), Max:99.9 F (37.7 C)  Recent Labs  Lab 05/09/18 0307  05/11/18 0225 05/11/18 1036 05/11/18 1807 05/12/18 0426 05/12/18 1709 05/12/18 1805 05/13/18 0240  WBC 12.7*  --  15.7*  --   --  19.0*  --  15.9* 19.0*  CREATININE 0.85   < > 0.88 0.76 0.80 0.73 0.81  --  0.71   < > = values in this interval not displayed.    Estimated Creatinine Clearance: 137.6 mL/min (by C-G formula based on SCr of 0.71 mg/dL).    No Known Allergies  Antimicrobials this admission: Cefepime 6/7 >> 6/12 Vanc 6/12 >>  Meropenem 6/12 >>  Microbiology results: 6/7 BCx: NGTD 6/7 UCx: no growth  6/10 Sputum: Pseudomonas aeruginosa   Thank you for allowing pharmacy to be a part of this patient's care.  Tera Materatherine A Rhyse Loux, PharmD, [facility] 05/13/2018 9:51 AM

## 2018-05-13 NOTE — Progress Notes (Signed)
x2 RN were at the bedside w/ the pt when his sats began to decrease - RN's in-line suctioned, gave multiple O2 breaths (100% O2), and sat pt's HOB up >45 degrees. Sats continued to maintain in the 80's and RRT was called to the bedside. (see Candise CheMorgan Garner, RRT note). Orders were given via eLink in attempt to correct oxygenation along with a stat CXR to view ventilation support devices. During this episode, pt became very tachycardic and hypertensive - medications were administered as needed in an attempt to correct.  Orders were completed as received.   After pt was stable (sats in the 90's) - RN did a full neuro assessment and found R pupil to be 5; L to be 3 (sluggish reaction bilaterally) and the on-call trauma MD was made aware of the entire situation at hand. RN received no new orders at this time as pt is too unstable to leave the unit for a scan.  Will continue to monitor closely.  Mom at bedside, educated on situation at hand (why we had to bag the pt, etc. And info regarding oxygen saturations). She is appropriate and understanding at this time.  Francia GreavesSavannah R Ninamarie Keel, RN

## 2018-05-13 NOTE — Progress Notes (Signed)
Trauma Service Note  Subjective: Patient has decompensating pulmonary status.  Upe to PEEP 16 and FIO2 90%  Objective: Vital signs in last 24 hours: Temp:  [97.4 F (36.3 C)-99.9 F (37.7 C)] 97.4 F (36.3 C) (06/12 0400) Pulse Rate:  [87-116] 92 (06/12 0900) Resp:  [10-24] 24 (06/12 0900) BP: (125-202)/(56-106) 170/92 (06/12 0900) SpO2:  [85 %-99 %] 94 % (06/12 0900) FiO2 (%):  [80 %-100 %] 90 % (06/12 0900) Weight:  [87.9 kg (193 lb 12.6 oz)] 87.9 kg (193 lb 12.6 oz) (06/12 0456) Last BM Date: 05/12/18  Intake/Output from previous day: 06/11 0701 - 06/12 0700 In: 7133.2 [I.V.:4269.9; Blood:630; NG/GT:1890; IV Piggyback:343.3] Out: 5750 [Urine:5700; Chest Tube:50] Intake/Output this shift: Total I/O In: 442 [I.V.:312; NG/GT:130] Out: 350 [Urine:350]  General: Paralyzed and sedated.  Lungs: Very shallow breath sounds.  Abd: Soft, distended, tolerating tube feedings  Extremities: No changes  Neuro: Sedated and paralyzed.  Lab Results: CBC  Recent Labs    05/12/18 1805 05/13/18 0240  WBC 15.9* 19.0*  HGB 7.6* 8.6*  HCT 24.2* 27.6*  PLT 94* 117*   BMET Recent Labs    05/12/18 1709 05/13/18 0240  NA 147* 142  K 3.9 3.4*  CL 101 98*  CO2 37* 33*  GLUCOSE 149* 163*  BUN 32* 31*  CREATININE 0.81 0.71  CALCIUM 7.2* 7.0*   PT/INR No results for input(s): LABPROT, INR in the last 72 hours. ABG Recent Labs    05/12/18 0839  PHART 7.328*  HCO3 37.3*    Studies/Results: Dg Chest Port 1 View  Result Date: 05/13/2018 CLINICAL DATA:  Respiratory failure, endotracheal tube EXAM: PORTABLE CHEST 1 VIEW COMPARISON:  None. FINDINGS: The tip of an endotracheal tube is 3.3 cm above the carina. Heart and mediastinal contours are stable. Two chest tubes are seen with tips coiled medially over the mid right hemithorax. Confluent airspace opacities and layering effusions are noted at each lung base without significant interval change. Gastric tube extends into the  expected location of the proximal stomach. No acute osseous abnormality. No pneumothorax. IMPRESSION: Pulmonary consolidations and presumed layering effusions at each lung base. No significant change. Satisfactory support line and tube positions. Electronically Signed   By: Tollie Ethavid  Kwon M.D.   On: 05/13/2018 02:27   Dg Chest Port 1 View  Result Date: 05/12/2018 CLINICAL DATA:  Hypoxia EXAM: PORTABLE CHEST 1 VIEW COMPARISON:  Portable exam 2046 hours compared to 05/12/2018 FINDINGS: Tip of endotracheal tube projects approximately 5.6 cm above carina. Nasogastric tube extends into stomach. LEFT arm PICC line tip projects over SVC near cavoatrial junction. Two pigtail RIGHT thoracostomy tubes are unchanged. Upper normal heart size. Stable mediastinal contours. BILATERAL pulmonary infiltrates asymmetrically greater on RIGHT with persistent RIGHT pleural effusion and basilar atelectasis. No pneumothorax or acute osseous findings. IMPRESSION: Persistent asymmetric pulmonary infiltrates with RIGHT pleural effusion and RIGHT basilar atelectasis. Electronically Signed   By: Ulyses SouthwardMark  Boles M.D.   On: 05/12/2018 21:00   Dg Chest Port 1 View  Result Date: 05/12/2018 CLINICAL DATA:  Pneumonia EXAM: PORTABLE CHEST 1 VIEW COMPARISON:  05/11/2018 FINDINGS: Interval placement of left PICC line with the tip in the SVC. Endotracheal tube and NG tube are stable. Two right chest tubes are in place, also stable. No pneumothorax. Perihilar and lower lobe airspace opacities are again noted, right greater than left, slightly improved on the left since prior study. Small right effusion. Heart is borderline in size. IMPRESSION: Interval placement of left PICC line with the  tip in the SVC near the cavoatrial junction. Right chest tubes remain in place without pneumothorax. Improving aeration in the left base with decreasing airspace disease. Continued patchy right lung airspace disease and right effusion. Electronically Signed   By: Charlett Nose M.D.   On: 05/12/2018 08:53    Anti-infectives: Anti-infectives (From admission, onward)   Start     Dose/Rate Route Frequency Ordered Stop   05/08/18 1700  vancomycin (VANCOCIN) IVPB 1000 mg/200 mL premix  Status:  Discontinued     1,000 mg 200 mL/hr over 60 Minutes Intravenous Every 8 hours 05/08/18 0802 05/09/18 1412   05/08/18 0815  ceFEPIme (MAXIPIME) 2 g in sodium chloride 0.9 % 100 mL IVPB  Status:  Discontinued     2 g 200 mL/hr over 30 Minutes Intravenous Every 8 hours 05/08/18 0803 05/13/18 0914   05/08/18 0800  vancomycin (VANCOCIN) 1,750 mg in sodium chloride 0.9 % 500 mL IVPB     1,750 mg 250 mL/hr over 120 Minutes Intravenous  Once 05/08/18 0802 05/08/18 1200   05/05/18 0900  ceFEPIme (MAXIPIME) 2 g in sodium chloride 0.9 % 100 mL IVPB  Status:  Discontinued     2 g 200 mL/hr over 30 Minutes Intravenous Every 12 hours 05/05/18 0755 05/08/18 0803   05/02/18 0600  ceFAZolin (ANCEF) IVPB 1 g/50 mL premix  Status:  Discontinued     1 g 100 mL/hr over 30 Minutes Intravenous Every 8 hours 05/02/18 0317 05/05/18 0755   05/02/18 0055  bacitracin 50,000 Units in sodium chloride 0.9 % 500 mL irrigation  Status:  Discontinued       As needed 05/02/18 0055 05/02/18 0209   05/01/18 2330  ceFAZolin (ANCEF) IVPB 2g/100 mL premix     2 g 200 mL/hr over 30 Minutes Intravenous  Once 05/01/18 2317 05/01/18 2355      Assessment/Plan: s/p Procedure(s): CRANIECTOMY AND REPAIR OF SCALP LACERATIONS I discussed with the critical care team the benefits and risks of prone ventilation and they had been reluctant to move in that direction because his C-spine had not been cleared.  Based on the CT of his C-spine, he does not seem to have an injur--therefore, I believe that itwould be safe to prone this patient, and we can remove the cervical collar.  LOS: 11 days   Marta Lamas. Gae Bon, MD, FACS [phone] Trauma Surgeon 05/13/2018
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
## 2018-05-13 NOTE — Progress Notes (Signed)
Cortrak Tube Team Note:  Consult received to place a Cortrak feeding tube.   A 10 F Cortrak tube was placed in the right nare and secured with a nasal bridle at 93 cm. Per the Cortrak monitor reading the tube tip is post pyloric .   X-ray is required, abdominal x-ray has been ordered by the Cortrak team. Please confirm tube placement before using the Cortrak tube.   If the tube becomes dislodged please keep the tube and contact the Cortrak team at [email or web address] ([email or web address]) for replacement.  If after hours and replacement cannot be delayed, place a NG tube and confirm placement with an abdominal x-ray.    Vanessa Kickarly Darly Fails RD, LDN Clinical Nutrition Pager # [phone]
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
## 2018-05-13 NOTE — Progress Notes (Signed)
RT entered room pt's head was turned to the right;pt is proned.  Upon turing his head with assistance of another RT and two RNs pt head is now facing left.

## 2018-05-13 NOTE — Progress Notes (Signed)
PT Cancellation Note  Patient Details Name: Frederick ForesterSteven Watling Jr. MRN: [id number] DOB: 1986-06-14   Cancelled Treatment:    Reason Eval/Treat Not Completed: Patient not medically ready.  Pt decompensating presently.  Unable to participate, 05/13/2018  [place] BingKen Carlina Molina, PT [phone]  (pager)   Frederick Molina 05/13/2018, 12:59 PM

## 2018-05-13 NOTE — Progress Notes (Addendum)
Patient proned at [id number]. No distress noted during turning of the patient. Site of craniectomy free of pressure. Site remains intact.  Will continue to monitor. Dicie BeamFrazier, Javoni Lucken RN BSN.

## 2018-05-13 NOTE — Progress Notes (Signed)
RT spoke with Dr. Vassie LollAlva and informed him that increased the trigger on the vent to -5 and the breath stacking stopped. Dr. Vassie LollAlva was ok with the change at this time. Information will be passed on the later RT and RN's.

## 2018-05-13 NOTE — Progress Notes (Signed)
OT Cancellation Note  Patient Details Name: Frederick ForesterSteven Hocutt Jr. MRN: [id number] DOB: 24-Aug-1986   Cancelled Treatment:    Reason Eval/Treat Not Completed: Patient not medically ready  [name], [name]   [name], [name]   OTR/L Pager: [phone] Office: [phone] .  05/13/2018, 11:32 AM

## 2018-05-13 NOTE — Progress Notes (Signed)
[phone]- Paged Dr. Lindie SpruceWyatt regarding patient double stacking and noncompliant with ventilator despite being maxed out on all sedation and paralytic. New orders received and carried out. Will continue to monitor. Dicie BeamFrazier, Rocio Wolak RN BSN.

## 2018-05-13 NOTE — Progress Notes (Signed)
eLink Physician-Brief Progress Note Patient Name: Frederick ForesterSteven Maruyama Jr. DOB: Apr 07, 1986 MRN: [id number]   Date of Service  05/13/2018  HPI/Events of Note  Hypoxia - Sat decreased to 84% by bedside monitor. STAT portable CXR looks wet. BP = 202/106.  eICU Interventions  Will order: 1. Increase PEEP to 16. 2. Lasix 40 mg IV now.  3. Better control of BP.     Intervention Category Major Interventions: Hypoxemia - evaluation and management  Sommer,Brannan Eugene 05/13/2018, 2:30 AM

## 2018-05-13 NOTE — Progress Notes (Signed)
RTs assisted in proning patient. No complications noted. Vitals stable throughout. SPO2 98%

## 2018-05-13 NOTE — Progress Notes (Signed)
Turned pt's head with assistance of two more RTs and three RNs pt's head is now facing right. No complications.

## 2018-05-13 NOTE — Progress Notes (Signed)
Called to bedside by RN due to a drop in patient's sats. Pt was taken off vent 3 times and bagged due a drop in SpO2 multiple times. Albuterol was given at 0231. MD orders PEEP to be increased to 16. Saturation is now 90% on 100% FiO2 and a PEEP of 16. RT will continue to monitor throughout the night.

## 2018-05-13 NOTE — Progress Notes (Addendum)
eLink Physician-Brief Progress Note Patient Name: Frederick ForesterSteven Mcneish Jr. DOB: May 19, 1986 MRN: [id number]   Date of Service  05/13/2018  HPI/Events of Note  Double trigger  eICU Interventions  Well paralysed In line neb removed, suctioned , increased trigger, improved waveforms  Drop FIO2, recheck ABG in 1h      Intervention Category Major Interventions: Respiratory failure - evaluation and management  Rakesh V. Alva 05/13/2018, 7:54 PM

## 2018-05-14 ENCOUNTER — Inpatient Hospital Stay (HOSPITAL_COMMUNITY): Payer: Self-pay

## 2018-05-14 DIAGNOSIS — M7989 Other specified soft tissue disorders: Secondary | ICD-10-CM

## 2018-05-14 DIAGNOSIS — J151 Pneumonia due to Pseudomonas: Secondary | ICD-10-CM

## 2018-05-14 LAB — BASIC METABOLIC PANEL
ANION GAP: 7 (ref 5–15)
BUN: 26 mg/dL — ABNORMAL HIGH (ref 6–20)
CHLORIDE: 102 mmol/L (ref 101–111)
CO2: 33 mmol/L — ABNORMAL HIGH (ref 22–32)
Calcium: 6.1 mg/dL — CL (ref 8.9–10.3)
Creatinine, Ser: 0.62 mg/dL (ref 0.61–1.24)
GFR calc Af Amer: >60 mL/min (ref >60)
GLUCOSE: 156 mg/dL — AB (ref 65–99)
POTASSIUM: 3.1 mmol/L — AB (ref 3.5–5.1)
SODIUM: 142 mmol/L (ref 135–145)

## 2018-05-14 LAB — CBC WITH DIFFERENTIAL/PLATELET
BASOS PCT: 0 %
Basophils Absolute: 0 10*3/uL (ref 0.0–0.1)
Eosinophils Absolute: 0.1 10*3/uL (ref 0.0–0.7)
Eosinophils Relative: 1 %
HCT: 20.8 % — ABNORMAL LOW (ref 39.0–52.0)
HEMOGLOBIN: 6.4 g/dL — AB (ref 13.0–17.0)
LYMPHS PCT: 7 %
Lymphs Abs: 0.9 10*3/uL (ref 0.7–4.0)
MCH: 28.3 pg (ref 26.0–34.0)
MCHC: 30.8 g/dL (ref 30.0–36.0)
MCV: 92 fL (ref 78.0–100.0)
MONOS PCT: 7 %
Monocytes Absolute: 0.9 10*3/uL (ref 0.1–1.0)
NEUTROS PCT: 85 %
Neutro Abs: 11.4 10*3/uL — ABNORMAL HIGH (ref 1.7–7.7)
Platelets: 116 10*3/uL — ABNORMAL LOW (ref 150–400)
RBC: 2.26 MIL/uL — ABNORMAL LOW (ref 4.22–5.81)
RDW: 15.8 % — ABNORMAL HIGH (ref 11.5–15.5)
WBC: 13.3 10*3/uL — ABNORMAL HIGH (ref 4.0–10.5)

## 2018-05-14 LAB — POCT I-STAT 3, ART BLOOD GAS (G3+)
Acid-Base Excess: 13 mmol/L — ABNORMAL HIGH (ref 0.0–2.0)
Bicarbonate: 39.4 mmol/L — ABNORMAL HIGH (ref 20.0–28.0)
O2 Saturation: 96 %
TCO2: 41 mmol/L — ABNORMAL HIGH (ref 22–32)
pCO2 arterial: 63.2 mmHg — ABNORMAL HIGH (ref 32.0–48.0)
pH, Arterial: 7.406 (ref 7.350–7.450)
pO2, Arterial: 87 mmHg (ref 83.0–108.0)

## 2018-05-14 LAB — CULTURE, RESPIRATORY

## 2018-05-14 LAB — GLUCOSE, CAPILLARY
GLUCOSE-CAPILLARY: 161 mg/dL — AB (ref 65–99)
GLUCOSE-CAPILLARY: 124 mg/dL — AB (ref 65–99)
GLUCOSE-CAPILLARY: 128 mg/dL — AB (ref 65–99)
Glucose-Capillary: 136 mg/dL — ABNORMAL HIGH (ref 65–99)
Glucose-Capillary: 135 mg/dL — ABNORMAL HIGH (ref 65–99)
Glucose-Capillary: 152 mg/dL — ABNORMAL HIGH (ref 65–99)

## 2018-05-14 LAB — CULTURE, RESPIRATORY W GRAM STAIN

## 2018-05-14 LAB — PREPARE RBC (CROSSMATCH)

## 2018-05-14 MED ORDER — SODIUM CHLORIDE 0.9 % IV SOLN
1.0000 g | Freq: Once | INTRAVENOUS | Status: AC
  Administered 2018-05-14: 1 g via INTRAVENOUS
  Filled 2018-05-14: qty 10

## 2018-05-14 MED ORDER — POTASSIUM CHLORIDE 20 MEQ/15ML (10%) PO SOLN
40.0000 meq | ORAL | Status: AC
  Administered 2018-05-14 (×3): 40 meq via ORAL
  Filled 2018-05-14 (×3): qty 30

## 2018-05-14 MED ORDER — DEXMEDETOMIDINE HCL IN NACL 400 MCG/100ML IV SOLN
0.0000 ug/kg/h | INTRAVENOUS | Status: DC
  Administered 2018-05-14 – 2018-05-16 (×13): 1 ug/kg/h via INTRAVENOUS
  Administered 2018-05-17: 1.4 ug/kg/h via INTRAVENOUS
  Administered 2018-05-17 (×2): 1 ug/kg/h via INTRAVENOUS
  Administered 2018-05-17: 1.7 ug/kg/h via INTRAVENOUS
  Administered 2018-05-17: 1 ug/kg/h via INTRAVENOUS
  Administered 2018-05-17: 1.1 ug/kg/h via INTRAVENOUS
  Filled 2018-05-14 (×18): qty 100

## 2018-05-14 MED ORDER — BACITRACIN ZINC 500 UNIT/GM EX OINT
TOPICAL_OINTMENT | Freq: Two times a day (BID) | CUTANEOUS | Status: DC
  Administered 2018-05-14: 21:00:00 via TOPICAL
  Filled 2018-05-14: qty 28.4

## 2018-05-14 MED ORDER — ENOXAPARIN SODIUM 40 MG/0.4ML ~~LOC~~ SOLN
40.0000 mg | SUBCUTANEOUS | Status: DC
  Administered 2018-05-14 – 2018-05-18 (×5): 40 mg via SUBCUTANEOUS
  Filled 2018-05-14 (×5): qty 0.4

## 2018-05-14 MED ORDER — FUROSEMIDE 10 MG/ML IJ SOLN
40.0000 mg | Freq: Four times a day (QID) | INTRAMUSCULAR | Status: AC
  Administered 2018-05-14 – 2018-05-15 (×4): 40 mg via INTRAVENOUS
  Filled 2018-05-14 (×4): qty 4

## 2018-05-14 NOTE — Progress Notes (Signed)
Turned pt's head with assistance of two RNs and a second RT. Pt's head is now facing right. No complications throughout changing the ETT position

## 2018-05-14 NOTE — Progress Notes (Signed)
Pt's head turned to right side.  ETT moved to right and remained secure at 24.  Pt tolerated well.

## 2018-05-14 NOTE — Progress Notes (Signed)
Per NP Babcock assessment plan weaned PEEP per protocol due to FIO2 results on morning ABG. Below is the following settings  Tidal Volume: 440 RR: 24 PEEP: 12 FIO2: 50%

## 2018-05-14 NOTE — Progress Notes (Signed)
OT Cancellation Note and Discontinue OT until Medically appropriate  Patient Details Name: Frederick ForesterSteven Gasior Jr. MRN: [id number] DOB: 07/06/1986   Cancelled Treatment:    Reason Eval/Treat Not Completed: Medical issues which prohibited therapy(OT to sign off at this time. Please re-order when medically ready)  Evern BioLaura J Dawnya Grams 05/14/2018, 8:26 AM  Sherryl MangesLaura Judy Goodenow OTR/L [phone]

## 2018-05-14 NOTE — Progress Notes (Addendum)
Turned pt's head with assistance of four RNs. Pt's head is now facing left. No complications throughout changing the ETT position.

## 2018-05-14 NOTE — Progress Notes (Signed)
Turned pt's head with assistance oftwo RNs and a second RT. Pt'shead is now facing left. No complicationsthroughoutchanging the ETT position.

## 2018-05-14 NOTE — Progress Notes (Signed)
1400 flushed both chest tubes with 30 cc each per MD order.

## 2018-05-14 NOTE — Progress Notes (Signed)
Trauma Service Note  Subjective: We really appreciate the help of CCM.  Proning this patient did seem to help significantly  Objective: Vital signs in last 24 hours: Temp:  [97.5 F (36.4 C)-100.1 F (37.8 C)] 99.2 F (37.3 C) (06/13 0820) Pulse Rate:  [95-120] 102 (06/13 0825) Resp:  [0-25] 25 (06/13 0825) BP: (115-167)/(58-99) 120/66 (06/13 0825) SpO2:  [92 %-100 %] 95 % (06/13 0825) FiO2 (%):  [50 %-90 %] 50 % (06/13 0825) Last BM Date: 05/12/18  Intake/Output from previous day: 06/12 0701 - 06/13 0700 In: 6052.1 [I.V.:3249.6; NG/GT:1960; IV Piggyback:842.5] Out: 8395 [Urine:8295; Chest Tube:100] Intake/Output this shift: No intake/output data recorded.  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  Will get wound care to see patient when he is proned again.  Lungs: Coarse breath sounds bilaterally  Abd: Soft, tolerating tube feedings in the post-pyloric position.  Extremities: No changes  Neuro: paralyzed and sedated.  Lab Results: CBC  Recent Labs    05/13/18 0240 05/14/18 0525  WBC 19.0* 13.3*  HGB 8.6* 6.4*  HCT 27.6* 20.8*  PLT 117* 116*   BMET Recent Labs    05/13/18 0240 05/14/18 0525  NA 142 142  K 3.4* 3.1*  CL 98* 102  CO2 33* 33*  GLUCOSE 163* 156*  BUN 31* 26*  CREATININE 0.71 0.62  CALCIUM 7.0* 6.1*   PT/INR No results for input(s): LABPROT, INR in the last 72 hours. ABG Recent Labs    05/13/18 2112 05/14/18 0403  PHART 7.407 7.406  HCO3 38.2* 39.4*    Studies/Results: Dg Chest Port 1 View  Result Date: 05/14/2018 CLINICAL DATA:  Atelectasis.  Pneumothorax. EXAM: PORTABLE CHEST 1 VIEW COMPARISON:  05/13/2018 FINDINGS: 2 pigtail chest tubes on the right unchanged in position. No pneumothorax on the right. Diffuse bilateral airspace disease in the lung bases with mild interval improvement. Endotracheal tube in good position.  Feeding tube enters the stomach. Left arm PICC tip in the lower SVC Multiple right rib fractures IMPRESSION: No pneumothorax on the right.  2 chest tubes remain on the right Bibasilar airspace disease with mild interval improvement. Electronically Signed   By: Marlan Palauharles  Clark M.D.   On: 05/14/2018 07:38   Dg Chest Port 1 View  Result Date: 05/13/2018 CLINICAL DATA:  Respiratory failure, endotracheal tube EXAM: PORTABLE CHEST 1 VIEW COMPARISON:  None. FINDINGS: The tip of an endotracheal tube is 3.3 cm above the carina. Heart and mediastinal contours are stable. Two chest tubes are seen with tips coiled medially over the mid right hemithorax. Confluent airspace opacities and layering effusions are noted at each lung base without significant interval change. Gastric tube extends into the expected location of the proximal stomach. No acute osseous abnormality. No pneumothorax. IMPRESSION: Pulmonary consolidations and presumed layering effusions at each lung base. No significant change. Satisfactory support line and tube positions. Electronically Signed   By: Tollie Ethavid  Kwon M.D.   On: 05/13/2018 02:27   Dg Chest Port 1 View  Result Date: 05/12/2018 CLINICAL DATA:  Hypoxia EXAM: PORTABLE CHEST 1 VIEW COMPARISON:  Portable exam 2046 hours compared to 05/12/2018 FINDINGS: Tip of endotracheal tube projects approximately 5.6 cm above carina. Nasogastric tube extends into stomach. LEFT arm PICC line tip projects over SVC near cavoatrial junction. Two pigtail RIGHT thoracostomy tubes are unchanged. Upper normal heart size. Stable mediastinal contours. BILATERAL pulmonary infiltrates asymmetrically greater on RIGHT  with persistent RIGHT pleural effusion and basilar atelectasis. No pneumothorax or acute osseous findings. IMPRESSION: Persistent asymmetric pulmonary infiltrates with RIGHT pleural effusion and RIGHT basilar atelectasis. Electronically Signed   By: Ulyses Southward M.D.   On: 05/12/2018 21:00   Dg Abd Portable  1v  Result Date: 05/13/2018 CLINICAL DATA:  Feeding tube placement. EXAM: PORTABLE ABDOMEN - 1 VIEW COMPARISON:  Single-view of the abdomen 05/06/2018. FINDINGS: Feeding tube is in place with the tip projecting in the duodenum at the ligament of Treitz. Bowel gas pattern is normal. IMPRESSION: As above. Electronically Signed   By: Drusilla Kanner M.D.   On: 05/13/2018 12:15    Anti-infectives: Anti-infectives (From admission, onward)   Start     Dose/Rate Route Frequency Ordered Stop   05/13/18 1830  vancomycin (VANCOCIN) IVPB 1000 mg/200 mL premix  Status:  Discontinued     1,000 mg 200 mL/hr over 60 Minutes Intravenous Every 8 hours 05/13/18 0936 05/14/18 0910   05/13/18 1030  vancomycin (VANCOCIN) 1,500 mg in sodium chloride 0.9 % 500 mL IVPB     1,500 mg 250 mL/hr over 120 Minutes Intravenous  Once 05/13/18 0936 05/13/18 1318   05/13/18 0945  meropenem (MERREM) 1 g in sodium chloride 0.9 % 100 mL IVPB     1 g 200 mL/hr over 30 Minutes Intravenous Every 8 hours 05/13/18 0936     05/08/18 1700  vancomycin (VANCOCIN) IVPB 1000 mg/200 mL premix  Status:  Discontinued     1,000 mg 200 mL/hr over 60 Minutes Intravenous Every 8 hours 05/08/18 0802 05/09/18 1412   05/08/18 0815  ceFEPIme (MAXIPIME) 2 g in sodium chloride 0.9 % 100 mL IVPB  Status:  Discontinued     2 g 200 mL/hr over 30 Minutes Intravenous Every 8 hours 05/08/18 0803 05/13/18 0914   05/08/18 0800  vancomycin (VANCOCIN) 1,750 mg in sodium chloride 0.9 % 500 mL IVPB     1,750 mg 250 mL/hr over 120 Minutes Intravenous  Once 05/08/18 0802 05/08/18 1200   05/05/18 0900  ceFEPIme (MAXIPIME) 2 g in sodium chloride 0.9 % 100 mL IVPB  Status:  Discontinued     2 g 200 mL/hr over 30 Minutes Intravenous Every 12 hours 05/05/18 0755 05/08/18 0803   05/02/18 0600  ceFAZolin (ANCEF) IVPB 1 g/50 mL premix  Status:  Discontinued     1 g 100 mL/hr over 30 Minutes Intravenous Every 8 hours 05/02/18 0317 05/05/18 0755   05/02/18 0055   bacitracin 50,000 Units in sodium chloride 0.9 % 500 mL irrigation  Status:  Discontinued       As needed 05/02/18 0055 05/02/18 0209   05/01/18 2330  ceFAZolin (ANCEF) IVPB 2g/100 mL premix     2 g 200 mL/hr over 30 Minutes Intravenous  Once 05/01/18 2317 05/01/18 2355      Assessment/Plan: s/p Procedure(s): CRANIECTOMY AND REPAIR OF SCALP LACERATIONS Diuresis Prone again in 4 hours Wound care to see back necrosis, it does not seem to be very deep right now.   LOS: 12 days   Marta Lamas. Gae Bon, MD, FACS [phone] Trauma Surgeon 05/14/2018

## 2018-05-14 NOTE — Progress Notes (Signed)
Turned pt's head with assistance oftwoRNs, a Tech and a second RT. Pt'shead is now facingleft. No complicationsthroughoutchanging the ETT position.  ETT is secured at 24cm.

## 2018-05-14 NOTE — Progress Notes (Signed)
Pt placed back to prone position at 1420.  ETT remained secured at 24.  Pt tolerated well. Family at bedside.

## 2018-05-14 NOTE — Progress Notes (Signed)
1400 paused Nimbex due to 0 twitches on train of four. Patient began breathing over the ventilator and still no twitches. Nimbex restarted at 9. Discussed with NP at bedside. Orders to titrate Nimbex for ventilator synchrony.

## 2018-05-14 NOTE — Progress Notes (Signed)
Pt's head turned to left.  Ett remained secure at 24.  Pt desated prior to head turn.  FIO2 increased to 100%, will titrate as able.
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
## 2018-05-14 NOTE — Progress Notes (Signed)
Bilateral lower extremity venous duplex has been completed. There is evidence of acute deep vein thrombosis involving the intramuscular gastrocnemius veins of the right and left lower extremities. Results were given to the patient's nurse, Lauren.  05/14/18 1:39 PM Olen CordialGreg Nattalie Santiesteban RVT

## 2018-05-14 NOTE — Progress Notes (Signed)
PT Cancellation Note  Patient Details Name: Frederick ForesterSteven Waterbury Jr. MRN: [id number] DOB: September 12, 1986   Cancelled Treatment:    Reason Eval/Treat Not Completed: Medical issues which prohibited therapy.  Will sign off at this time and ask for reorder and activity when pt becomes appropriate for therapies. 05/14/2018  Ester BingKen Aymen Widrig, PT [phone]  (pager)   Eliseo GumKenneth V Chantell Kunkler 05/14/2018, 11:42 AM
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
## 2018-05-14 NOTE — Progress Notes (Signed)
CRITICAL VALUE ALERT  Critical Value: Hgb 6.4 & Calcium 6.1  Date & Time Notied: 05/14/2018  Provider Notified: on-call trauma MD  Orders Received/Actions taken: To administer 1 unit PRBC and a dose of calcium gluconate IVPB.  Francia GreavesSavannah R Anabelen Kaminsky, RN

## 2018-05-15 ENCOUNTER — Inpatient Hospital Stay (HOSPITAL_COMMUNITY): Payer: Self-pay

## 2018-05-15 DIAGNOSIS — J9601 Acute respiratory failure with hypoxia: Secondary | ICD-10-CM

## 2018-05-15 LAB — POCT I-STAT 3, ART BLOOD GAS (G3+)
ACID-BASE EXCESS: 14 mmol/L — AB (ref 0.0–2.0)
ACID-BASE EXCESS: 15 mmol/L — AB (ref 0.0–2.0)
Acid-Base Excess: 16 mmol/L — ABNORMAL HIGH (ref 0.0–2.0)
Acid-Base Excess: 13 mmol/L — ABNORMAL HIGH (ref 0.0–2.0)
BICARBONATE: 42.9 mmol/L — AB (ref 20.0–28.0)
BICARBONATE: 40.7 mmol/L — AB (ref 20.0–28.0)
Bicarbonate: 41.6 mmol/L — ABNORMAL HIGH (ref 20.0–28.0)
Bicarbonate: 44.1 mmol/L — ABNORMAL HIGH (ref 20.0–28.0)
O2 SAT: 90 %
O2 SAT: 100 %
O2 Saturation: 99 %
O2 Saturation: 91 %
PCO2 ART: 71.1 mmHg — AB (ref 32.0–48.0)
PCO2 ART: 70.8 mmHg — AB (ref 32.0–48.0)
PH ART: 7.375 (ref 7.350–7.450)
PH ART: 7.285 — AB (ref 7.350–7.450)
PO2 ART: 156 mmHg — AB (ref 83.0–108.0)
PO2 ART: 71 mmHg — AB (ref 83.0–108.0)
Patient temperature: 99.7
TCO2: 44 mmol/L — ABNORMAL HIGH (ref 22–32)
TCO2: 47 mmol/L — AB (ref 22–32)
TCO2: 45 mmol/L — AB (ref 22–32)
TCO2: 43 mmol/L — ABNORMAL HIGH (ref 22–32)
pCO2 arterial: >91 mmHg (ref 32.0–48.0)
pCO2 arterial: 75.3 mmHg (ref 32.0–48.0)
pH, Arterial: 7.37 (ref 7.350–7.450)
pH, Arterial: 7.373 (ref 7.350–7.450)
pO2, Arterial: 64 mmHg — ABNORMAL LOW (ref 83.0–108.0)
pO2, Arterial: 227 mmHg — ABNORMAL HIGH (ref 83.0–108.0)

## 2018-05-15 LAB — BASIC METABOLIC PANEL
ANION GAP: 4 — AB (ref 5–15)
Anion gap: 9 (ref 5–15)
BUN: 29 mg/dL — ABNORMAL HIGH (ref 6–20)
BUN: 29 mg/dL — ABNORMAL HIGH (ref 6–20)
CALCIUM: 7.8 mg/dL — AB (ref 8.9–10.3)
CHLORIDE: 95 mmol/L — AB (ref 101–111)
CO2: 40 mmol/L — ABNORMAL HIGH (ref 22–32)
CO2: 41 mmol/L — AB (ref 22–32)
Calcium: 7.7 mg/dL — ABNORMAL LOW (ref 8.9–10.3)
Chloride: 97 mmol/L — ABNORMAL LOW (ref 101–111)
Creatinine, Ser: 0.74 mg/dL (ref 0.61–1.24)
Creatinine, Ser: 0.73 mg/dL (ref 0.61–1.24)
GFR calc non Af Amer: >60 mL/min (ref >60)
GLUCOSE: 149 mg/dL — AB (ref 65–99)
Glucose, Bld: 152 mg/dL — ABNORMAL HIGH (ref 65–99)
POTASSIUM: 4.1 mmol/L (ref 3.5–5.1)
Potassium: 4.2 mmol/L (ref 3.5–5.1)
SODIUM: 144 mmol/L (ref 135–145)
Sodium: 142 mmol/L (ref 135–145)

## 2018-05-15 LAB — CBC
HCT: 29.7 % — ABNORMAL LOW (ref 39.0–52.0)
HEMOGLOBIN: 9.3 g/dL — AB (ref 13.0–17.0)
MCH: 28.4 pg (ref 26.0–34.0)
MCHC: 31.3 g/dL (ref 30.0–36.0)
MCV: 90.8 fL (ref 78.0–100.0)
PLATELETS: 218 10*3/uL (ref 150–400)
RBC: 3.27 MIL/uL — ABNORMAL LOW (ref 4.22–5.81)
RDW: 15.4 % (ref 11.5–15.5)
WBC: 16.4 10*3/uL — ABNORMAL HIGH (ref 4.0–10.5)

## 2018-05-15 LAB — TYPE AND SCREEN
ABO/RH(D): B POS
Antibody Screen: NEGATIVE
UNIT DIVISION: 0
UNIT DIVISION: 0
Unit division: 0
Unit division: 0
Unit division: 0

## 2018-05-15 LAB — CBC WITH DIFFERENTIAL/PLATELET
BASOS ABS: 0.2 10*3/uL — AB (ref 0.0–0.1)
Basophils Relative: 1 %
EOS PCT: 1 %
Eosinophils Absolute: 0.2 10*3/uL (ref 0.0–0.7)
HEMATOCRIT: 31 % — AB (ref 39.0–52.0)
HEMOGLOBIN: 9.6 g/dL — AB (ref 13.0–17.0)
Lymphocytes Relative: 9 %
Lymphs Abs: 1.5 10*3/uL (ref 0.7–4.0)
MCH: 28.6 pg (ref 26.0–34.0)
MCHC: 31 g/dL (ref 30.0–36.0)
MCV: 92.3 fL (ref 78.0–100.0)
MONOS PCT: 8 %
Monocytes Absolute: 1.4 10*3/uL — ABNORMAL HIGH (ref 0.1–1.0)
Neutro Abs: 13.6 10*3/uL — ABNORMAL HIGH (ref 1.7–7.7)
Neutrophils Relative %: 81 %
Platelets: 221 10*3/uL (ref 150–400)
RBC: 3.36 MIL/uL — AB (ref 4.22–5.81)
RDW: 15.6 % — AB (ref 11.5–15.5)
WBC: 16.9 10*3/uL — AB (ref 4.0–10.5)

## 2018-05-15 LAB — ECHOCARDIOGRAM COMPLETE
HEIGHTINCHES: 66 in
Weight: 3097.02 oz

## 2018-05-15 LAB — BPAM RBC
BLOOD PRODUCT EXPIRATION DATE: 201906202359
BLOOD PRODUCT EXPIRATION DATE: 201907072359
Blood Product Expiration Date: 201906142359
Blood Product Expiration Date: 201907062359
Blood Product Expiration Date: 201907072359
ISSUE DATE / TIME: 201906111014
ISSUE DATE / TIME: 201906111042
ISSUE DATE / TIME: 201906130804
ISSUE DATE / TIME: 201906110814
ISSUE DATE / TIME: 201906110854
UNIT TYPE AND RH: 5100
UNIT TYPE AND RH: 7300
Unit Type and Rh: 5100
Unit Type and Rh: 5100
Unit Type and Rh: 7300

## 2018-05-15 LAB — PHOSPHORUS: Phosphorus: 3.9 mg/dL (ref 2.5–4.6)

## 2018-05-15 LAB — GLUCOSE, CAPILLARY
GLUCOSE-CAPILLARY: 128 mg/dL — AB (ref 65–99)
GLUCOSE-CAPILLARY: 128 mg/dL — AB (ref 65–99)
GLUCOSE-CAPILLARY: 132 mg/dL — AB (ref 65–99)
Glucose-Capillary: 141 mg/dL — ABNORMAL HIGH (ref 65–99)
Glucose-Capillary: 140 mg/dL — ABNORMAL HIGH (ref 65–99)

## 2018-05-15 LAB — MAGNESIUM: MAGNESIUM: 2.5 mg/dL — AB (ref 1.7–2.4)

## 2018-05-15 MED ORDER — FUROSEMIDE 10 MG/ML IJ SOLN
40.0000 mg | Freq: Four times a day (QID) | INTRAMUSCULAR | Status: AC
  Administered 2018-05-15 – 2018-05-16 (×4): 40 mg via INTRAVENOUS
  Filled 2018-05-15 (×5): qty 4

## 2018-05-15 MED ORDER — POTASSIUM CHLORIDE 20 MEQ/15ML (10%) PO SOLN
40.0000 meq | Freq: Once | ORAL | Status: AC
  Administered 2018-05-15: 40 meq via ORAL
  Filled 2018-05-15: qty 30

## 2018-05-15 NOTE — Progress Notes (Signed)
Turned pt's head with assistance oftwoRNs, and  second RT. Pt'shead is now facing left. Not complications and ETT is secured at 24.
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
## 2018-05-15 NOTE — Progress Notes (Signed)
Pt currently still in prone position. Pt placed on 100% FIO2. Pts head turned to pts left with no complications. ETT moved to pts left. Sats remained stable. HR 109, RT sxn ETT - small pink frothy thin. Post pt positioning RT weaned FIO2 60%, Sats 94%, HR 108. Pt tol well. RT will continue to follow

## 2018-05-15 NOTE — Progress Notes (Signed)
Pt is still in prone position. Assisted by a second RT an Charity fundraiserN and a Tech.  Pt's head turned to pt's left with no complications. ETT moved to pts left and secured at 24. Sats remained stable. Pt suctioned after head reposition.

## 2018-05-15 NOTE — Progress Notes (Signed)
RT sxn ETT mod frothy pink tinged, small mucus plugs. Pt tol well. Sats remained stable through sxn ETT.
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
## 2018-05-15 NOTE — Progress Notes (Signed)
  Echocardiogram 2D Echocardiogram has been performed.  Janalyn HarderWest, Valori Hollenkamp R 05/15/2018, 12:40 PM

## 2018-05-15 NOTE — Progress Notes (Signed)
Turned pt's head with assistance oftwoRNs, and a second RT. Pt'shead is now facingright.  No  Complications. ETT is secure at 24.

## 2018-05-15 NOTE — Progress Notes (Signed)
Turned pt's head with assistance ofthree RNs and a second RT. Pt'shead is now facing right. Not complications. ETT is secured at 24.

## 2018-05-15 NOTE — Progress Notes (Signed)
Turned pt's head with assistance ofthree RNs. Pt'shead is now facing left. Not complications. ETT is secured at 24.

## 2018-05-15 NOTE — Progress Notes (Signed)
Pt is still in prone position. Assisted by a second RT and twp RNs.  Pts head turned to pts right with no complications. ETT moved to pts right and secured at 24. Sats remained stable. Pt suctioned after head reposition.

## 2018-05-15 NOTE — Progress Notes (Signed)
Trauma Service Note  Subjective: Patient not doing that well.  Still proning and using ARDSnet protocol.. PaCO2 75  Objective: Vital signs in last 24 hours: Temp:  [98.8 F (37.1 C)-101.1 F (38.4 C)] 101.1 F (38.4 C) (06/14 0737) Pulse Rate:  [11-119] 113 (06/14 0937) Resp:  [5-30] 30 (06/14 0937) BP: (104-176)/(34-88) 104/49 (06/14 0900) SpO2:  [92 %-100 %] 100 % (06/14 0947) FiO2 (%):  [40 %-100 %] 60 % (06/14 1116) Weight:  [87.8 kg (193 lb 9 oz)] 87.8 kg (193 lb 9 oz) (06/14 0500) Last BM Date: 05/12/18  Intake/Output from previous day: 06/13 0701 - 06/14 0700 In: 5721.3 [I.V.:2769.6; Blood:315; NG/GT:2210; IV Piggyback:426.7] Out: 1610 [RUEAV:4098; Chest Tube:330] Intake/Output this shift: No intake/output data recorded.  General: Sedated and paralyzed  Lungs: ARDS  Abd: Tolerating feeds  Extremities: No changes  Neuro: Sedated and paralyzed  Lab Results: CBC  Recent Labs    05/14/18 2358 05/15/18 0453  WBC 16.4* 16.9*  HGB 9.3* 9.6*  HCT 29.7* 31.0*  PLT 218 221   BMET Recent Labs    05/14/18 2358 05/15/18 0453  NA 144 142  K 4.2 4.1  CL 95* 97*  CO2 40* 41*  GLUCOSE 152* 149*  BUN 29* 29*  CREATININE 0.74 0.73  CALCIUM 7.8* 7.7*   PT/INR No results for input(s): LABPROT, INR in the last 72 hours. ABG Recent Labs    05/15/18 0611 05/15/18 0919  PHART 7.285* 7.370  HCO3 44.1* 42.9*    Studies/Results: Dg Chest Port 1 View  Result Date: 05/15/2018 CLINICAL DATA:  Respiratory failure EXAM: PORTABLE CHEST 1 VIEW COMPARISON:  05/14/2018 FINDINGS: Two pigtail chest tubes are noted on the right. Cardiac shadow is stable. Bilateral infiltrative changes right greater than left are again seen and relatively stable. Left-sided PICC line and feeding catheter are noted in satisfactory position. The endotracheal tube is noted at the level of the thoracic inlet. No new focal abnormality is seen. Rib fractures are again seen on the right and stable.  IMPRESSION: Stable bilateral infiltrates. Tubes and lines as described.  No pneumothorax is noted. Multiple right rib fractures stable from the previous exam. Electronically Signed   By: Alcide Clever M.D.   On: 05/15/2018 06:46   Dg Chest Port 1 View  Result Date: 05/14/2018 CLINICAL DATA:  Atelectasis.  Pneumothorax. EXAM: PORTABLE CHEST 1 VIEW COMPARISON:  05/13/2018 FINDINGS: 2 pigtail chest tubes on the right unchanged in position. No pneumothorax on the right. Diffuse bilateral airspace disease in the lung bases with mild interval improvement. Endotracheal tube in good position. Feeding tube enters the stomach. Left arm PICC tip in the lower SVC Multiple right rib fractures IMPRESSION: No pneumothorax on the right.  2 chest tubes remain on the right Bibasilar airspace disease with mild interval improvement. Electronically Signed   By: Marlan Palau M.D.   On: 05/14/2018 07:38   Dg Abd Portable 1v  Result Date: 05/13/2018 CLINICAL DATA:  Feeding tube placement. EXAM: PORTABLE ABDOMEN - 1 VIEW COMPARISON:  Single-view of the abdomen 05/06/2018. FINDINGS: Feeding tube is in place with the tip projecting in the duodenum at the ligament of Treitz. Bowel gas pattern is normal. IMPRESSION: As above. Electronically Signed   By: Drusilla Kanner M.D.   On: 05/13/2018 12:15    Anti-infectives: Anti-infectives (From admission, onward)   Start     Dose/Rate Route Frequency Ordered Stop   05/13/18 1830  vancomycin (VANCOCIN) IVPB 1000 mg/200 mL premix  Status:  Discontinued  1,000 mg 200 mL/hr over 60 Minutes Intravenous Every 8 hours 05/13/18 0936 05/14/18 0910   05/13/18 1030  vancomycin (VANCOCIN) 1,500 mg in sodium chloride 0.9 % 500 mL IVPB     1,500 mg 250 mL/hr over 120 Minutes Intravenous  Once 05/13/18 0936 05/13/18 1318   05/13/18 0945  meropenem (MERREM) 1 g in sodium chloride 0.9 % 100 mL IVPB     1 g 200 mL/hr over 30 Minutes Intravenous Every 8 hours 05/13/18 0936     05/08/18 1700   vancomycin (VANCOCIN) IVPB 1000 mg/200 mL premix  Status:  Discontinued     1,000 mg 200 mL/hr over 60 Minutes Intravenous Every 8 hours 05/08/18 0802 05/09/18 1412   05/08/18 0815  ceFEPIme (MAXIPIME) 2 g in sodium chloride 0.9 % 100 mL IVPB  Status:  Discontinued     2 g 200 mL/hr over 30 Minutes Intravenous Every 8 hours 05/08/18 0803 05/13/18 0914   05/08/18 0800  vancomycin (VANCOCIN) 1,750 mg in sodium chloride 0.9 % 500 mL IVPB     1,750 mg 250 mL/hr over 120 Minutes Intravenous  Once 05/08/18 0802 05/08/18 1200   05/05/18 0900  ceFEPIme (MAXIPIME) 2 g in sodium chloride 0.9 % 100 mL IVPB  Status:  Discontinued     2 g 200 mL/hr over 30 Minutes Intravenous Every 12 hours 05/05/18 0755 05/08/18 0803   05/02/18 0600  ceFAZolin (ANCEF) IVPB 1 g/50 mL premix  Status:  Discontinued     1 g 100 mL/hr over 30 Minutes Intravenous Every 8 hours 05/02/18 0317 05/05/18 0755   05/02/18 0055  bacitracin 50,000 Units in sodium chloride 0.9 % 500 mL irrigation  Status:  Discontinued       As needed 05/02/18 0055 05/02/18 0209   05/01/18 2330  ceFAZolin (ANCEF) IVPB 2g/100 mL premix     2 g 200 mL/hr over 30 Minutes Intravenous  Once 05/01/18 2317 05/01/18 2355      Assessment/Plan: s/p Procedure(s): CRANIECTOMY AND REPAIR OF SCALP LACERATIONS No changes from our standpoint.  LOS: 13 days   Marta LamasJames O. Gae BonWyatt, III, MD, FACS [phone] Trauma Surgeon 05/15/2018

## 2018-05-15 NOTE — Progress Notes (Signed)
New order placed by MD.  Order was for FIO2: 80 PEEP:16 RR:30.  RT increased PEEP, RR, and FIO2.  Obtained ABG after an hour and called results to MD at [facility]ink,  MD stated that he updated RR on previous order but did not intend on to make changes on PEEP and FIO2.  Changes made to the vent are as follow.  RR: 30 PEEP 12, FIO2 60. Tidal volume remained 440. MD requested Endtidal CO2 to be placed. Per MD to redraw ABG in two hours.

## 2018-05-15 NOTE — Progress Notes (Signed)
Pt changed to prone position.  ETT remained stable at 24.  Pt tolerated position change well.

## 2018-05-15 NOTE — Progress Notes (Signed)
Pt turned to supine position.  ETT remained secure at 24. Cuff leak heard, air added to cuff.  Lower lip is very swollen. NP notified for both the cuff and lip.

## 2018-05-16 ENCOUNTER — Inpatient Hospital Stay (HOSPITAL_COMMUNITY): Payer: Self-pay

## 2018-05-16 LAB — GLUCOSE, CAPILLARY
GLUCOSE-CAPILLARY: 131 mg/dL — AB (ref 65–99)
GLUCOSE-CAPILLARY: 127 mg/dL — AB (ref 65–99)
GLUCOSE-CAPILLARY: 127 mg/dL — AB (ref 65–99)
GLUCOSE-CAPILLARY: 153 mg/dL — AB (ref 65–99)
GLUCOSE-CAPILLARY: 123 mg/dL — AB (ref 65–99)
Glucose-Capillary: 129 mg/dL — ABNORMAL HIGH (ref 65–99)

## 2018-05-16 LAB — COMPREHENSIVE METABOLIC PANEL
ALBUMIN: 1.5 g/dL — AB (ref 3.5–5.0)
ALT: 75 U/L — AB (ref 17–63)
ANION GAP: 7 (ref 5–15)
AST: 81 U/L — AB (ref 15–41)
Alkaline Phosphatase: 102 U/L (ref 38–126)
BUN: 29 mg/dL — ABNORMAL HIGH (ref 6–20)
CALCIUM: 7.8 mg/dL — AB (ref 8.9–10.3)
CO2: 39 mmol/L — AB (ref 22–32)
CREATININE: 0.68 mg/dL (ref 0.61–1.24)
Chloride: 95 mmol/L — ABNORMAL LOW (ref 101–111)
GFR calc Af Amer: >60 mL/min (ref >60)
GFR calc non Af Amer: >60 mL/min (ref >60)
GLUCOSE: 137 mg/dL — AB (ref 65–99)
Potassium: 3.9 mmol/L (ref 3.5–5.1)
SODIUM: 141 mmol/L (ref 135–145)
Total Bilirubin: 0.5 mg/dL (ref 0.3–1.2)
Total Protein: 5.4 g/dL — ABNORMAL LOW (ref 6.5–8.1)

## 2018-05-16 LAB — CBC WITH DIFFERENTIAL/PLATELET
Abs Immature Granulocytes: 0.7 10*3/uL — ABNORMAL HIGH (ref 0.0–0.1)
Basophils Absolute: 0.1 10*3/uL (ref 0.0–0.1)
Basophils Relative: 0 %
Eosinophils Absolute: 0.1 10*3/uL (ref 0.0–0.7)
Eosinophils Relative: 1 %
HEMATOCRIT: 29.5 % — AB (ref 39.0–52.0)
HEMOGLOBIN: 9 g/dL — AB (ref 13.0–17.0)
Immature Granulocytes: 5 %
LYMPHS ABS: 1.8 10*3/uL (ref 0.7–4.0)
LYMPHS PCT: 12 %
MCH: 28.3 pg (ref 26.0–34.0)
MCHC: 30.5 g/dL (ref 30.0–36.0)
MCV: 92.8 fL (ref 78.0–100.0)
Monocytes Absolute: 1.7 10*3/uL — ABNORMAL HIGH (ref 0.1–1.0)
Monocytes Relative: 12 %
Neutro Abs: 10.3 10*3/uL — ABNORMAL HIGH (ref 1.7–7.7)
Neutrophils Relative %: 70 %
Platelets: 286 10*3/uL (ref 150–400)
RBC: 3.18 MIL/uL — ABNORMAL LOW (ref 4.22–5.81)
RDW: 15.4 % (ref 11.5–15.5)
WBC: 14.6 10*3/uL — AB (ref 4.0–10.5)

## 2018-05-16 LAB — POCT I-STAT 3, ART BLOOD GAS (G3+)
Acid-Base Excess: 15 mmol/L — ABNORMAL HIGH (ref 0.0–2.0)
Bicarbonate: 42.4 mmol/L — ABNORMAL HIGH (ref 20.0–28.0)
O2 SAT: 98 %
PCO2 ART: 68.5 mmHg — AB (ref 32.0–48.0)
PO2 ART: 113 mmHg — AB (ref 83.0–108.0)
Patient temperature: 99.8
TCO2: 44 mmol/L — ABNORMAL HIGH (ref 22–32)
pH, Arterial: 7.402 (ref 7.350–7.450)

## 2018-05-16 LAB — MAGNESIUM: Magnesium: 2.6 mg/dL — ABNORMAL HIGH (ref 1.7–2.4)

## 2018-05-16 LAB — PHOSPHORUS: Phosphorus: 3.7 mg/dL (ref 2.5–4.6)

## 2018-05-16 MED ORDER — DOCUSATE SODIUM 50 MG/5ML PO LIQD
100.0000 mg | Freq: Two times a day (BID) | ORAL | Status: DC
  Administered 2018-05-16 – 2018-05-26 (×7): 100 mg
  Filled 2018-05-16 (×10): qty 10

## 2018-05-16 MED ORDER — FUROSEMIDE 10 MG/ML IJ SOLN
40.0000 mg | Freq: Three times a day (TID) | INTRAMUSCULAR | Status: DC
  Administered 2018-05-16 – 2018-05-17 (×3): 40 mg via INTRAVENOUS
  Filled 2018-05-16 (×3): qty 4

## 2018-05-16 NOTE — Progress Notes (Signed)
While patient was proned, unable to elicit twitches at the radial or ulnar sites at 10, 20, or 30 mA. Once pt was repositioned to the supine position, TOF was attempted again at the left eyebrow with 10 mA and four twitches were noted. Patient stacked his respirations following his head repositioning at 1000, but has appeared to be well-sedated aside from the isolated incident. BIS monitor has been unreliable due to the patient's diaphoretic skin. Dr. Loran SentersKloefkorn of [facility] was made aware of the situation. As the plan is to wean the patient off of the paralytic tomorrow, there were no new orders for the patient.

## 2018-05-16 NOTE — Progress Notes (Signed)
Staples at craniectomy site removed. Area is clean, dry, and intact and open to air.

## 2018-05-16 NOTE — Progress Notes (Signed)
Pt's head turned from right to left.  ETT remained stable at 24.  Pt tolerated well.

## 2018-05-16 NOTE — Progress Notes (Signed)
Pharmacy Antibiotic Note  Frederick ForesterSteven Napoleon Jr. is a 32 y.o. male admitted on 05/01/2018 with pneumonia.  Pharmacy has been consulted for Meropenem dosing. Tmax is 101.1 and WBC is elevated at 14.6. SCr is WNL and stable.   Plan: Continue meropenem 1gm IV Q8H F/u renal fxn, C&S, clinical status and LOT  Height: 5\' 6"  (167.6 cm) Weight: 192 lb 0.3 oz (87.1 kg) IBW/kg (Calculated) : 63.8  Temp (24hrs), Avg:99.9 F (37.7 C), Min:99 F (37.2 C), Max:101.1 F (38.4 C)  Recent Labs  Lab 05/13/18 0240 05/14/18 0525 05/14/18 2358 05/15/18 0453 05/16/18 0611  WBC 19.0* 13.3* 16.4* 16.9* 14.6*  CREATININE 0.71 0.62 0.74 0.73 0.68    Estimated Creatinine Clearance: 137.1 mL/min (by C-G formula based on SCr of 0.68 mg/dL).    No Known Allergies  Antimicrobials this admission: Cefepime 6/7 >> 6/12 Vanc 6/12 >> 6/13 Meropenem 6/12 >>  Microbiology results: 6/7 BCx: NGTD 6/7 UCx: no growth  6/10 Sputum: Pseudomonas aeruginosa   Thank you for allowing pharmacy to be a part of this patient's care.  Frederick Molina, Frederick Molina, PharmD 05/16/2018 2:33 PM

## 2018-05-16 NOTE — Progress Notes (Signed)
SLP Cancellation Note  Patient Details Name: Frederick ForesterSteven Sao Jr. MRN: [id number] DOB: Nov 22, 1986   Cancelled treatment:       Reason Eval/Treat Not Completed: Patient not medically ready   Frederick Molina, [name] 05/16/2018, 7:21 AM

## 2018-05-16 NOTE — Progress Notes (Signed)
Ptisstill prone.Assisted bytwoRNs and a second RT.Pt's head turned to pt'sright with no complications. ETT moved to pt'srightand secured at 24.Sats remained stable.
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
## 2018-05-16 NOTE — Progress Notes (Signed)
Pt turned to supine position. ETT remained stable at 24.  Pt tolerated well.  RT will continue to monitor.
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
## 2018-05-16 NOTE — Progress Notes (Signed)
Ptisstill prone.Assisted by a second RT an Charity fundraiserN and a Tech.Pt's head turned to pt's rightwith no complications. ETT moved to pt's right and secured at 24.Sats remained stable. Pt suctioned after head reposition.

## 2018-05-16 NOTE — Progress Notes (Signed)
Ptisstill prone.Assisted bytwoRNs.Pt's head turned to pt'sleft with no complications. ETT moved to pt'sleftand secured at 24.Sats remained stable.

## 2018-05-16 NOTE — Progress Notes (Signed)
Subjective: Patient reports proned, on ventilator  Objective: Vital signs in last 24 hours: Temp:  [99.2 F (37.3 C)-101.1 F (38.4 C)] 99.2 F (37.3 C) (06/15 0800) Pulse Rate:  [93-125] 103 (06/15 0830) Resp:  [27-30] 30 (06/15 0830) BP: (101-157)/(54-96) 120/84 (06/15 0830) SpO2:  [91 %-100 %] 99 % (06/15 0830) FiO2 (%):  [50 %-100 %] 50 % (06/15 0911) Weight:  [87.1 kg (192 lb 0.3 oz)] 87.1 kg (192 lb 0.3 oz) (06/15 0500)  Intake/Output from previous day: 06/14 0701 - 06/15 0700 In: 7428 [I.V.:3691.3; NG/GT:2300; IV Piggyback:1436.7] Out: 6250 [Urine:6050; Chest Tube:200] Intake/Output this shift: Total I/O In: -  Out: 1145 [Urine:1145]  Physical Exam: Sedfated, paralyzed, prone ventilation.  Lab Results: Recent Labs    05/15/18 0453 05/16/18 0611  WBC 16.9* 14.6*  HGB 9.6* 9.0*  HCT 31.0* 29.5*  PLT 221 286   BMET Recent Labs    05/15/18 0453 05/16/18 0611  NA 142 141  K 4.1 3.9  CL 97* 95*  CO2 41* 39*  GLUCOSE 149* 137*  BUN 29* 29*  CREATININE 0.73 0.68  CALCIUM 7.7* 7.8*    Studies/Results: Dg Chest Port 1 View  Result Date: 05/16/2018 CLINICAL DATA:  Respiratory failure EXAM: PORTABLE CHEST 1 VIEW COMPARISON:  05/15/2018 FINDINGS: Two right chest tubes remain in place, unchanged. No visible pneumothorax. Bilateral airspace opacities are again noted, stable. Heart is borderline in size. IMPRESSION: No significant change since prior study. Electronically Signed   By: Charlett NoseKevin  Dover M.D.   On: 05/16/2018 08:40   Dg Chest Port 1 View  Result Date: 05/15/2018 CLINICAL DATA:  Respiratory failure EXAM: PORTABLE CHEST 1 VIEW COMPARISON:  05/14/2018 FINDINGS: Two pigtail chest tubes are noted on the right. Cardiac shadow is stable. Bilateral infiltrative changes right greater than left are again seen and relatively stable. Left-sided PICC line and feeding catheter are noted in satisfactory position. The endotracheal tube is noted at the level of the thoracic  inlet. No new focal abnormality is seen. Rib fractures are again seen on the right and stable. IMPRESSION: Stable bilateral infiltrates. Tubes and lines as described.  No pneumothorax is noted. Multiple right rib fractures stable from the previous exam. Electronically Signed   By: Alcide CleverMark  Lukens M.D.   On: 05/15/2018 06:46    Assessment/Plan: Continuing prone ventilation for ARDS.  No new neurosurgical recommendations.    LOS: 14 days    Dorian HeckleSTERN,Dearius Hoffmann D, MD 05/16/2018, 9:57 AM

## 2018-05-16 NOTE — Progress Notes (Signed)
ABG drawn and resulted.  Critical PCO2 results. RT called and spoke to MD about critical result.

## 2018-05-17 ENCOUNTER — Inpatient Hospital Stay (HOSPITAL_COMMUNITY): Payer: Self-pay

## 2018-05-17 LAB — CBC
HCT: 27.7 % — ABNORMAL LOW (ref 39.0–52.0)
HEMOGLOBIN: 8.5 g/dL — AB (ref 13.0–17.0)
MCH: 28.5 pg (ref 26.0–34.0)
MCHC: 30.7 g/dL (ref 30.0–36.0)
MCV: 93 fL (ref 78.0–100.0)
Platelets: 379 10*3/uL (ref 150–400)
RBC: 2.98 MIL/uL — AB (ref 4.22–5.81)
RDW: 15.5 % (ref 11.5–15.5)
WBC: 15.6 10*3/uL — AB (ref 4.0–10.5)

## 2018-05-17 LAB — GLUCOSE, CAPILLARY
GLUCOSE-CAPILLARY: 130 mg/dL — AB (ref 65–99)
GLUCOSE-CAPILLARY: 129 mg/dL — AB (ref 65–99)
Glucose-Capillary: 133 mg/dL — ABNORMAL HIGH (ref 65–99)
Glucose-Capillary: 131 mg/dL — ABNORMAL HIGH (ref 65–99)
Glucose-Capillary: 114 mg/dL — ABNORMAL HIGH (ref 65–99)
Glucose-Capillary: 145 mg/dL — ABNORMAL HIGH (ref 65–99)

## 2018-05-17 LAB — BASIC METABOLIC PANEL
Anion gap: 8 (ref 5–15)
BUN: 26 mg/dL — ABNORMAL HIGH (ref 6–20)
CHLORIDE: 98 mmol/L — AB (ref 101–111)
CO2: 35 mmol/L — ABNORMAL HIGH (ref 22–32)
Calcium: 7.1 mg/dL — ABNORMAL LOW (ref 8.9–10.3)
Creatinine, Ser: 0.61 mg/dL (ref 0.61–1.24)
Glucose, Bld: 121 mg/dL — ABNORMAL HIGH (ref 65–99)
Potassium: 3.4 mmol/L — ABNORMAL LOW (ref 3.5–5.1)
SODIUM: 141 mmol/L (ref 135–145)

## 2018-05-17 MED ORDER — SODIUM CHLORIDE 0.9 % IV SOLN
0.5000 mg/h | INTRAVENOUS | Status: DC
  Administered 2018-05-17: 0.5 mg/h via INTRAVENOUS
  Administered 2018-05-17: 4 mg/h via INTRAVENOUS
  Filled 2018-05-17 (×3): qty 5

## 2018-05-17 MED ORDER — HYDROMORPHONE BOLUS VIA INFUSION
1.0000 mg | INTRAVENOUS | Status: DC | PRN
  Administered 2018-05-17 – 2018-05-18 (×11): 1 mg via INTRAVENOUS
  Filled 2018-05-17: qty 1

## 2018-05-17 MED ORDER — FUROSEMIDE 10 MG/ML IJ SOLN
40.0000 mg | Freq: Four times a day (QID) | INTRAMUSCULAR | Status: DC
  Administered 2018-05-17 – 2018-05-18 (×6): 40 mg via INTRAVENOUS
  Filled 2018-05-17 (×6): qty 4

## 2018-05-17 NOTE — Progress Notes (Signed)
Wasted 15 mL Versed in sink with Sharen HeckLisa Strandberg, RN.
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
## 2018-05-17 NOTE — Progress Notes (Signed)
eLink Physician-Brief Progress Note Patient Name: Frederick ForesterSteven Bourcier Jr. DOB: Aug 19, 1986 MRN: [id number]   Date of Service  05/17/2018  HPI/Events of Note  Agitation - BP = 155/118. HR = 114.   eICU Interventions  Will increase ceiling on Precedex to 1.7 mcg/kg/hour.     Intervention Category Major Interventions: Delirium, psychosis, severe agitation - evaluation and management  Sommer,Mando Eugene 05/17/2018, 8:11 PM
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
## 2018-05-18 ENCOUNTER — Inpatient Hospital Stay (HOSPITAL_COMMUNITY): Payer: Self-pay

## 2018-05-18 LAB — POCT I-STAT 3, ART BLOOD GAS (G3+)
Acid-Base Excess: 17 mmol/L — ABNORMAL HIGH (ref 0.0–2.0)
Bicarbonate: 43.1 mmol/L — ABNORMAL HIGH (ref 20.0–28.0)
O2 Saturation: 96 %
PCO2 ART: 58.7 mmHg — AB (ref 32.0–48.0)
PH ART: 7.473 — AB (ref 7.350–7.450)
TCO2: 45 mmol/L — ABNORMAL HIGH (ref 22–32)
pO2, Arterial: 80 mmHg — ABNORMAL LOW (ref 83.0–108.0)

## 2018-05-18 LAB — BASIC METABOLIC PANEL
Anion gap: 10 (ref 5–15)
BUN: 29 mg/dL — AB (ref 6–20)
CHLORIDE: 94 mmol/L — AB (ref 101–111)
CO2: 39 mmol/L — ABNORMAL HIGH (ref 22–32)
CREATININE: 0.63 mg/dL (ref 0.61–1.24)
Calcium: 8.3 mg/dL — ABNORMAL LOW (ref 8.9–10.3)
GFR calc Af Amer: >60 mL/min (ref >60)
GFR calc non Af Amer: >60 mL/min (ref >60)
GLUCOSE: 119 mg/dL — AB (ref 65–99)
POTASSIUM: 3.4 mmol/L — AB (ref 3.5–5.1)
Sodium: 143 mmol/L (ref 135–145)

## 2018-05-18 LAB — URINALYSIS, ROUTINE W REFLEX MICROSCOPIC
BILIRUBIN URINE: NEGATIVE
Glucose, UA: NEGATIVE mg/dL
Hgb urine dipstick: NEGATIVE
Ketones, ur: NEGATIVE mg/dL
Leukocytes, UA: NEGATIVE
NITRITE: NEGATIVE
PROTEIN: NEGATIVE mg/dL
SPECIFIC GRAVITY, URINE: 1.02 (ref 1.005–1.030)
pH: 6 (ref 5.0–8.0)

## 2018-05-18 LAB — PROCALCITONIN: PROCALCITONIN: 0.4 ng/mL

## 2018-05-18 LAB — GLUCOSE, CAPILLARY
GLUCOSE-CAPILLARY: 113 mg/dL — AB (ref 65–99)
GLUCOSE-CAPILLARY: 119 mg/dL — AB (ref 65–99)
GLUCOSE-CAPILLARY: 133 mg/dL — AB (ref 65–99)
Glucose-Capillary: 113 mg/dL — ABNORMAL HIGH (ref 65–99)
Glucose-Capillary: 111 mg/dL — ABNORMAL HIGH (ref 65–99)
Glucose-Capillary: 117 mg/dL — ABNORMAL HIGH (ref 65–99)

## 2018-05-18 LAB — CBC
HCT: 31.2 % — ABNORMAL LOW (ref 39.0–52.0)
HEMOGLOBIN: 9.7 g/dL — AB (ref 13.0–17.0)
MCH: 28.8 pg (ref 26.0–34.0)
MCHC: 31.1 g/dL (ref 30.0–36.0)
MCV: 92.6 fL (ref 78.0–100.0)
PLATELETS: 466 10*3/uL — AB (ref 150–400)
RBC: 3.37 MIL/uL — AB (ref 4.22–5.81)
RDW: 15.3 % (ref 11.5–15.5)
WBC: 16.7 10*3/uL — ABNORMAL HIGH (ref 4.0–10.5)

## 2018-05-18 LAB — LACTIC ACID, PLASMA
LACTIC ACID, VENOUS: 1.1 mmol/L (ref 0.5–1.9)
LACTIC ACID, VENOUS: 0.9 mmol/L (ref 0.5–1.9)
Lactic Acid, Venous: 1.1 mmol/L (ref 0.5–1.9)

## 2018-05-18 LAB — MRSA PCR SCREENING: MRSA BY PCR: NEGATIVE

## 2018-05-18 LAB — TRIGLYCERIDES: Triglycerides: 114 mg/dL (ref <150)

## 2018-05-18 MED ORDER — SUCCINYLCHOLINE CHLORIDE 20 MG/ML IJ SOLN
100.0000 mg | Freq: Once | INTRAMUSCULAR | Status: AC
  Administered 2018-05-18: 100 mg via INTRAVENOUS

## 2018-05-18 MED ORDER — POTASSIUM CHLORIDE 20 MEQ/15ML (10%) PO SOLN
40.0000 meq | Freq: Once | ORAL | Status: AC
  Administered 2018-05-18: 40 meq
  Filled 2018-05-18: qty 30

## 2018-05-18 MED ORDER — MIDAZOLAM HCL 2 MG/2ML IJ SOLN
1.0000 mg | INTRAMUSCULAR | Status: DC | PRN
  Administered 2018-05-19 – 2018-05-31 (×8): 1 mg via INTRAVENOUS
  Filled 2018-05-18 (×8): qty 2

## 2018-05-18 MED ORDER — ETOMIDATE 2 MG/ML IV SOLN
20.0000 mg | Freq: Once | INTRAVENOUS | Status: AC
  Administered 2018-05-18: 20 mg via INTRAVENOUS

## 2018-05-18 MED ORDER — ACETYLCYSTEINE 20 % IN SOLN
4.0000 mL | RESPIRATORY_TRACT | Status: DC
Start: 2018-05-18 — End: 2018-05-24
  Administered 2018-05-18 – 2018-05-24 (×34): 4 mL via RESPIRATORY_TRACT
  Filled 2018-05-18 (×36): qty 4

## 2018-05-18 MED ORDER — VANCOMYCIN HCL IN DEXTROSE 1-5 GM/200ML-% IV SOLN
1000.0000 mg | Freq: Three times a day (TID) | INTRAVENOUS | Status: DC
  Administered 2018-05-18 – 2018-05-20 (×6): 1000 mg via INTRAVENOUS
  Filled 2018-05-18 (×6): qty 200

## 2018-05-18 MED ORDER — IPRATROPIUM-ALBUTEROL 0.5-2.5 (3) MG/3ML IN SOLN
3.0000 mL | RESPIRATORY_TRACT | Status: DC
  Administered 2018-05-18 – 2018-05-24 (×36): 3 mL via RESPIRATORY_TRACT
  Filled 2018-05-18 (×37): qty 3

## 2018-05-18 MED ORDER — SODIUM CHLORIDE 0.9 % IV SOLN
INTRAVENOUS | Status: DC
  Administered 2018-05-18 – 2018-05-22 (×6): via INTRAVENOUS
  Administered 2018-05-23: 1 mL via INTRAVENOUS
  Administered 2018-05-24: 03:00:00 via INTRAVENOUS
  Administered 2018-05-24: 1 mL via INTRAVENOUS
  Administered 2018-05-25: 06:00:00 via INTRAVENOUS
  Administered 2018-05-25: 1 mL via INTRAVENOUS
  Administered 2018-05-26 – 2018-06-03 (×10): via INTRAVENOUS

## 2018-05-18 MED ORDER — VANCOMYCIN HCL 10 G IV SOLR
1500.0000 mg | Freq: Once | INTRAVENOUS | Status: AC
  Administered 2018-05-18: 1500 mg via INTRAVENOUS
  Filled 2018-05-18: qty 1500

## 2018-05-18 MED ORDER — IPRATROPIUM-ALBUTEROL 0.5-2.5 (3) MG/3ML IN SOLN: 3.0000 mL | Freq: Four times a day (QID) | RESPIRATORY_TRACT | Status: DC

## 2018-05-18 MED ORDER — QUETIAPINE FUMARATE 25 MG PO TABS
50.0000 mg | ORAL_TABLET | Freq: Two times a day (BID) | ORAL | Status: DC
  Administered 2018-05-18 – 2018-05-20 (×5): 50 mg
  Filled 2018-05-18 (×5): qty 2

## 2018-05-18 MED ORDER — PROPOFOL 1000 MG/100ML IV EMUL
5.0000 ug/kg/min | INTRAVENOUS | Status: DC
  Administered 2018-05-18: 40 ug/kg/min via INTRAVENOUS
  Administered 2018-05-18: 10 ug/kg/min via INTRAVENOUS
  Administered 2018-05-18 (×2): 40 ug/kg/min via INTRAVENOUS
  Administered 2018-05-18: 50 ug/kg/min via INTRAVENOUS
  Administered 2018-05-18 – 2018-05-20 (×7): 40 ug/kg/min via INTRAVENOUS
  Administered 2018-05-20: 20 ug/kg/min via INTRAVENOUS
  Administered 2018-05-20: 40 ug/kg/min via INTRAVENOUS
  Administered 2018-05-20: 30 ug/kg/min via INTRAVENOUS
  Administered 2018-05-21 – 2018-05-22 (×3): 20 ug/kg/min via INTRAVENOUS
  Administered 2018-05-22: 15 ug/kg/min via INTRAVENOUS
  Administered 2018-05-22 – 2018-05-23 (×3): 20 ug/kg/min via INTRAVENOUS
  Administered 2018-05-23: 25 ug/kg/min via INTRAVENOUS
  Administered 2018-05-23 – 2018-05-24 (×2): 20 ug/kg/min via INTRAVENOUS
  Filled 2018-05-18 (×25): qty 100

## 2018-05-18 MED ORDER — FENTANYL CITRATE (PF) 100 MCG/2ML IJ SOLN
25.0000 ug | INTRAMUSCULAR | Status: DC | PRN
  Administered 2018-05-28 – 2018-05-29 (×3): 100 ug via INTRAVENOUS
  Filled 2018-05-18 (×2): qty 2

## 2018-05-18 MED ORDER — CLONAZEPAM 0.5 MG PO TBDP
0.5000 mg | ORAL_TABLET | Freq: Two times a day (BID) | ORAL | Status: DC
  Administered 2018-05-18 – 2018-05-20 (×5): 0.5 mg via ORAL
  Filled 2018-05-18 (×5): qty 1

## 2018-05-18 MED ORDER — CLONAZEPAM 0.1 MG/ML ORAL SUSPENSION: 0.5000 mg | Freq: Two times a day (BID) | ORAL | Status: DC
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
## 2018-05-18 NOTE — Progress Notes (Signed)
eLink Physician-Brief Progress Note Patient Name: Frederick ForesterSteven Dingus Jr. DOB: June 18, 1986 MRN: [id number]   Date of Service  05/18/2018  HPI/Events of Note  Severe Agitation.   eICU Interventions  Will order: 1. Restart Propofol IV infusion. 2. Monitor Triglyceride levels.      Intervention Category Major Interventions: Delirium, psychosis, severe agitation - evaluation and management  Qunisha Bryk,Valon Eugene 05/18/2018, 12:13 AM

## 2018-05-18 NOTE — Progress Notes (Signed)
Patient is developing a contraction metabolic alkalosis from over-diuresis. Will stop the Lasix. Start NS @ 75cc/hr. Asked RN to check CVP to decide if needs bolus. Discussed with bedside RN.

## 2018-05-18 NOTE — Procedures (Signed)
Noted difficulty passing suction catheter through ETT.  Requested CXR, placed by RN.  Please see CXR report.  Re-intubated by Dr. Merlene PullingHammonds.
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
## 2018-05-18 NOTE — Progress Notes (Signed)
CXR on my review shows RLL infiltrate, ETT now in good position, stomach distended with air. Have asked RN to suction back on NG tube. Review of chart shows patient had high lactate and procal at one point but was never trended. Had fever yesterday. Blood cultures (6/16): pending. Obtain new sputum culture now. Obtain new UA/culture. Check lactate and procal and mrsa nares. D/c Guaifenesin. Most recent sputum gram stain shows GPC's. Patient has never been swabbed for MRSA. Will add Vanc; continue Meropenem. Obtain new ABG post re-intubation. Currently on both fentanyl and dilaudid infusions which is a little redundant. Will d/c Dilaudid infusion and increase fentanyl infusion as needed. Continue propofol infusion; will need to monitor triglycerides closely as they were very high earlier this admission. On review of his MAR, this coincides with when he was last on propofol, on doses of 50-4280mcg. Will adjust current propofol order to max at 40mcg. Given how thick his ETT secretions are and difficult to suction, will add chest PT via bed module (safe to do per Dr Venetia MaxonStern of Neurosurgery). Will need to change to a regular ICU bed for this as his current proning bed can't do chest PT. Change albuterol nebs to duonebs and add mucomyst nebs to be given during chest PT.   45 minutes nonprocedural critical care time  Milana ObeyKathleen Sergio Zawislak, MD Pulmonary & Critical Care Medicine Pager: [phone]
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
## 2018-05-18 NOTE — Procedures (Signed)
Endotracheal Intubation Procedure Note Indication for endotracheal intubation: airway compromise and respiratory failure Sedation: etomidate and fentanyl Paralytic: succinylcholine Equipment: Macintosh 3 laryngoscope blade and 7.385mm cuffed endotracheal tube Number of attempts: 1 ETT location confirmed by by auscultation, by CXR and ETCO2 monitor.  Called to patient's bedside emergently as ETT recorded as 10.5cm above the carina and RT unable to pass in-line suction. On my exam patient mildly hypoxic at 92%. ETT 22cm at the teeth. Unable to pass in-line suction on my attempt. Patient already on fentanyl, dilaudid, and propofol infusions. Gave 50mcg bolus of fentanyl, 20mg  etomidate, and 100mg  Succ. Removed old ETT, suctioned and ambu bagged patient, then placed new 7.5cm ETT, secured 26cm at the teeth. CXR pending. Of note patient had a difficult airway due to anterior cords.

## 2018-05-18 NOTE — Progress Notes (Signed)
25 mL of Dilaudid wasted in sink with Beverly GustLauren Hall, RN.

## 2018-05-19 ENCOUNTER — Inpatient Hospital Stay (HOSPITAL_COMMUNITY): Payer: Self-pay

## 2018-05-19 LAB — BASIC METABOLIC PANEL
Anion gap: 11 (ref 5–15)
Anion gap: 7 (ref 5–15)
BUN: 23 mg/dL — AB (ref 6–20)
BUN: 23 mg/dL — AB (ref 6–20)
CALCIUM: 8 mg/dL — AB (ref 8.9–10.3)
CALCIUM: 7.2 mg/dL — AB (ref 8.9–10.3)
CHLORIDE: 102 mmol/L (ref 101–111)
CO2: 36 mmol/L — ABNORMAL HIGH (ref 22–32)
CO2: 32 mmol/L (ref 22–32)
CREATININE: 0.54 mg/dL — AB (ref 0.61–1.24)
CREATININE: 0.46 mg/dL — AB (ref 0.61–1.24)
Chloride: 96 mmol/L — ABNORMAL LOW (ref 101–111)
GFR calc Af Amer: >60 mL/min (ref >60)
GFR calc non Af Amer: >60 mL/min (ref >60)
GLUCOSE: 141 mg/dL — AB (ref 65–99)
Glucose, Bld: 103 mg/dL — ABNORMAL HIGH (ref 65–99)
Potassium: 2.8 mmol/L — ABNORMAL LOW (ref 3.5–5.1)
Potassium: 5.2 mmol/L — ABNORMAL HIGH (ref 3.5–5.1)
SODIUM: 143 mmol/L (ref 135–145)
SODIUM: 141 mmol/L (ref 135–145)

## 2018-05-19 LAB — CBC
HCT: 29.9 % — ABNORMAL LOW (ref 39.0–52.0)
Hemoglobin: 9.1 g/dL — ABNORMAL LOW (ref 13.0–17.0)
MCH: 27.7 pg (ref 26.0–34.0)
MCHC: 30.4 g/dL (ref 30.0–36.0)
MCV: 91.2 fL (ref 78.0–100.0)
PLATELETS: 490 10*3/uL — AB (ref 150–400)
RBC: 3.28 MIL/uL — AB (ref 4.22–5.81)
RDW: 15.3 % (ref 11.5–15.5)
WBC: 13.3 10*3/uL — ABNORMAL HIGH (ref 4.0–10.5)

## 2018-05-19 LAB — POCT I-STAT 3, ART BLOOD GAS (G3+)
Acid-Base Excess: 15 mmol/L — ABNORMAL HIGH (ref 0.0–2.0)
BICARBONATE: 38.9 mmol/L — AB (ref 20.0–28.0)
O2 Saturation: 95 %
PH ART: 7.555 — AB (ref 7.350–7.450)
PO2 ART: 72 mmHg — AB (ref 83.0–108.0)
Patient temperature: 100.5
TCO2: 40 mmol/L — ABNORMAL HIGH (ref 22–32)
pCO2 arterial: 44.2 mmHg (ref 32.0–48.0)

## 2018-05-19 LAB — GLUCOSE, CAPILLARY
GLUCOSE-CAPILLARY: 111 mg/dL — AB (ref 65–99)
GLUCOSE-CAPILLARY: 137 mg/dL — AB (ref 65–99)
Glucose-Capillary: 127 mg/dL — ABNORMAL HIGH (ref 65–99)
Glucose-Capillary: 135 mg/dL — ABNORMAL HIGH (ref 65–99)
Glucose-Capillary: 110 mg/dL — ABNORMAL HIGH (ref 65–99)

## 2018-05-19 LAB — MAGNESIUM: Magnesium: 2.5 mg/dL — ABNORMAL HIGH (ref 1.7–2.4)

## 2018-05-19 LAB — CULTURE, RESPIRATORY

## 2018-05-19 LAB — CULTURE, RESPIRATORY W GRAM STAIN

## 2018-05-19 LAB — PHOSPHORUS: Phosphorus: 3.6 mg/dL (ref 2.5–4.6)

## 2018-05-19 LAB — CK TOTAL AND CKMB (NOT AT ARMC)
CK, MB: 1.4 ng/mL (ref 0.5–5.0)
Relative Index: 0.5 (ref 0.0–2.5)
Total CK: 280 U/L (ref 49–397)

## 2018-05-19 LAB — PROCALCITONIN: PROCALCITONIN: 0.36 ng/mL

## 2018-05-19 LAB — LACTIC ACID, PLASMA: Lactic Acid, Venous: 0.9 mmol/L (ref 0.5–1.9)

## 2018-05-19 MED ORDER — POTASSIUM CHLORIDE 20 MEQ/15ML (10%) PO SOLN
40.0000 meq | Freq: Once | ORAL | Status: AC
  Administered 2018-05-19: 40 meq
  Filled 2018-05-19: qty 30

## 2018-05-19 MED ORDER — POTASSIUM CHLORIDE 10 MEQ/50ML IV SOLN
10.0000 meq | INTRAVENOUS | Status: AC
  Administered 2018-05-19 (×4): 10 meq via INTRAVENOUS
  Filled 2018-05-19 (×4): qty 50

## 2018-05-19 MED ORDER — ENOXAPARIN SODIUM 100 MG/ML ~~LOC~~ SOLN
1.0000 mg/kg | Freq: Two times a day (BID) | SUBCUTANEOUS | Status: DC
  Administered 2018-05-19 – 2018-05-21 (×6): 90 mg via SUBCUTANEOUS
  Administered 2018-05-22: 100 mg via SUBCUTANEOUS
  Administered 2018-05-22 – 2018-05-23 (×2): 90 mg via SUBCUTANEOUS
  Filled 2018-05-19 (×10): qty 0.9
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
## 2018-05-19 NOTE — Progress Notes (Signed)
Patient ID: Frederick ForesterSteven Fanara Jr., male   DOB: [birth year], 32 y.o.   MRN: [id number] I D/W Dr. Venetia MaxonStern - OK for full anticoagulation for DVT at this point. Frederick GelinasBurke Betzy Barbier, MD, MPH, FACS Trauma: [phone] General Surgery: [phone]
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
## 2018-05-20 LAB — CULTURE, RESPIRATORY W GRAM STAIN

## 2018-05-20 LAB — BASIC METABOLIC PANEL
ANION GAP: 6 (ref 5–15)
BUN: 21 mg/dL — ABNORMAL HIGH (ref 6–20)
CALCIUM: 7.8 mg/dL — AB (ref 8.9–10.3)
CO2: 31 mmol/L (ref 22–32)
Chloride: 104 mmol/L (ref 101–111)
Creatinine, Ser: 0.48 mg/dL — ABNORMAL LOW (ref 0.61–1.24)
Glucose, Bld: 110 mg/dL — ABNORMAL HIGH (ref 65–99)
Potassium: 3.9 mmol/L (ref 3.5–5.1)
Sodium: 141 mmol/L (ref 135–145)

## 2018-05-20 LAB — POCT I-STAT 3, ART BLOOD GAS (G3+)
Acid-Base Excess: 6 mmol/L — ABNORMAL HIGH (ref 0.0–2.0)
Bicarbonate: 30.2 mmol/L — ABNORMAL HIGH (ref 20.0–28.0)
O2 Saturation: 94 %
PO2 ART: 71 mmHg — AB (ref 83.0–108.0)
TCO2: 31 mmol/L (ref 22–32)
pCO2 arterial: 42.9 mmHg (ref 32.0–48.0)
pH, Arterial: 7.459 — ABNORMAL HIGH (ref 7.350–7.450)

## 2018-05-20 LAB — GLUCOSE, CAPILLARY
GLUCOSE-CAPILLARY: 106 mg/dL — AB (ref 65–99)
GLUCOSE-CAPILLARY: 109 mg/dL — AB (ref 65–99)
GLUCOSE-CAPILLARY: 126 mg/dL — AB (ref 65–99)
Glucose-Capillary: 100 mg/dL — ABNORMAL HIGH (ref 65–99)
Glucose-Capillary: 108 mg/dL — ABNORMAL HIGH (ref 65–99)
Glucose-Capillary: 115 mg/dL — ABNORMAL HIGH (ref 65–99)
Glucose-Capillary: 90 mg/dL (ref 65–99)

## 2018-05-20 LAB — CBC
HEMATOCRIT: 28.9 % — AB (ref 39.0–52.0)
Hemoglobin: 8.9 g/dL — ABNORMAL LOW (ref 13.0–17.0)
MCH: 28.3 pg (ref 26.0–34.0)
MCHC: 30.8 g/dL (ref 30.0–36.0)
MCV: 91.7 fL (ref 78.0–100.0)
PLATELETS: 529 10*3/uL — AB (ref 150–400)
RBC: 3.15 MIL/uL — ABNORMAL LOW (ref 4.22–5.81)
RDW: 15.8 % — AB (ref 11.5–15.5)
WBC: 13.2 10*3/uL — AB (ref 4.0–10.5)

## 2018-05-20 LAB — PHOSPHORUS: PHOSPHORUS: 3.5 mg/dL (ref 2.5–4.6)

## 2018-05-20 LAB — MAGNESIUM: MAGNESIUM: 2.2 mg/dL (ref 1.7–2.4)

## 2018-05-20 LAB — PROCALCITONIN: Procalcitonin: 0.24 ng/mL

## 2018-05-20 LAB — CULTURE, RESPIRATORY: CULTURE: NORMAL

## 2018-05-20 MED ORDER — CLONAZEPAM 0.5 MG PO TBDP: 1.0000 mg | ORAL_TABLET | Freq: Three times a day (TID) | ORAL | Status: DC

## 2018-05-20 MED ORDER — GUAIFENESIN 100 MG/5ML PO SOLN
5.0000 mL | Freq: Four times a day (QID) | ORAL | Status: DC
  Administered 2018-05-20 – 2018-06-03 (×56): 100 mg
  Filled 2018-05-20 (×5): qty 5
  Filled 2018-05-20: qty 10
  Filled 2018-05-20 (×4): qty 5
  Filled 2018-05-20: qty 10
  Filled 2018-05-20 (×6): qty 5
  Filled 2018-05-20: qty 10
  Filled 2018-05-20 (×37): qty 5
  Filled 2018-05-20: qty 20

## 2018-05-20 MED ORDER — CLONAZEPAM 0.5 MG PO TBDP
0.5000 mg | ORAL_TABLET | ORAL | Status: AC
  Administered 2018-05-20: 0.5 mg
  Filled 2018-05-20: qty 1

## 2018-05-20 MED ORDER — CLONAZEPAM 0.5 MG PO TBDP: 0.5000 mg | ORAL_TABLET | ORAL | Status: DC

## 2018-05-20 MED ORDER — QUETIAPINE FUMARATE 100 MG PO TABS
100.0000 mg | ORAL_TABLET | Freq: Three times a day (TID) | ORAL | Status: DC
  Administered 2018-05-20 – 2018-05-21 (×3): 100 mg
  Filled 2018-05-20 (×3): qty 1

## 2018-05-20 MED ORDER — CLONAZEPAM 0.1 MG/ML ORAL SUSPENSION
1.0000 mg | Freq: Three times a day (TID) | ORAL | Status: DC
  Filled 2018-05-20: qty 10

## 2018-05-20 MED ORDER — CLONAZEPAM 0.5 MG PO TBDP
1.0000 mg | ORAL_TABLET | Freq: Three times a day (TID) | ORAL | Status: DC
  Administered 2018-05-20 (×2): 1 mg
  Filled 2018-05-20 (×2): qty 2

## 2018-05-20 MED ORDER — CLONAZEPAM 0.1 MG/ML ORAL SUSPENSION
0.5000 mg | ORAL | Status: DC
  Filled 2018-05-20: qty 5

## 2018-05-20 MED ORDER — CHLORHEXIDINE GLUCONATE CLOTH 2 % EX PADS
6.0000 | MEDICATED_PAD | Freq: Every day | CUTANEOUS | Status: DC
  Administered 2018-05-21 – 2018-05-27 (×7): 6 via TOPICAL

## 2018-05-20 NOTE — Progress Notes (Signed)
PS trial attempted per MD order.  Pt had no pt effort and HR decreased to 30.  Pt placed back back on full support and quickly recovered.  RN at bedside and aware.  RT will monitor
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
## 2018-05-21 ENCOUNTER — Inpatient Hospital Stay (HOSPITAL_COMMUNITY): Payer: Self-pay

## 2018-05-21 LAB — BASIC METABOLIC PANEL
ANION GAP: 8 (ref 5–15)
BUN: 19 mg/dL (ref 6–20)
CO2: 27 mmol/L (ref 22–32)
Calcium: 7.9 mg/dL — ABNORMAL LOW (ref 8.9–10.3)
Chloride: 105 mmol/L (ref 101–111)
Creatinine, Ser: 0.49 mg/dL — ABNORMAL LOW (ref 0.61–1.24)
Glucose, Bld: 112 mg/dL — ABNORMAL HIGH (ref 65–99)
POTASSIUM: 4 mmol/L (ref 3.5–5.1)
SODIUM: 140 mmol/L (ref 135–145)

## 2018-05-21 LAB — CBC
HCT: 27.5 % — ABNORMAL LOW (ref 39.0–52.0)
HEMOGLOBIN: 8.5 g/dL — AB (ref 13.0–17.0)
MCH: 28.3 pg (ref 26.0–34.0)
MCHC: 30.9 g/dL (ref 30.0–36.0)
MCV: 91.7 fL (ref 78.0–100.0)
PLATELETS: 510 10*3/uL — AB (ref 150–400)
RBC: 3 MIL/uL — AB (ref 4.22–5.81)
RDW: 15.9 % — ABNORMAL HIGH (ref 11.5–15.5)
WBC: 10.2 10*3/uL (ref 4.0–10.5)

## 2018-05-21 LAB — GLUCOSE, CAPILLARY
GLUCOSE-CAPILLARY: 109 mg/dL — AB (ref 65–99)
GLUCOSE-CAPILLARY: 102 mg/dL — AB (ref 65–99)
Glucose-Capillary: 102 mg/dL — ABNORMAL HIGH (ref 65–99)
Glucose-Capillary: 114 mg/dL — ABNORMAL HIGH (ref 65–99)
Glucose-Capillary: 101 mg/dL — ABNORMAL HIGH (ref 65–99)

## 2018-05-21 LAB — BLOOD GAS, ARTERIAL
Acid-Base Excess: 5.3 mmol/L — ABNORMAL HIGH (ref 0.0–2.0)
BICARBONATE: 29.1 mmol/L — AB (ref 20.0–28.0)
Drawn by: 41422
FIO2: 40
LHR: 30 {breaths}/min
O2 Saturation: 96.8 %
PCO2 ART: 42.9 mmHg (ref 32.0–48.0)
PEEP: 8 cmH2O
Patient temperature: 99.5
VT: 440 mL
pH, Arterial: 7.45 (ref 7.350–7.450)
pO2, Arterial: 91.3 mmHg (ref 83.0–108.0)

## 2018-05-21 LAB — MAGNESIUM: MAGNESIUM: 2.3 mg/dL (ref 1.7–2.4)

## 2018-05-21 LAB — TRIGLYCERIDES: Triglycerides: 89 mg/dL (ref <150)

## 2018-05-21 LAB — PHOSPHORUS: PHOSPHORUS: 4.2 mg/dL (ref 2.5–4.6)

## 2018-05-21 MED ORDER — CLONAZEPAM 0.5 MG PO TBDP
2.0000 mg | ORAL_TABLET | Freq: Three times a day (TID) | ORAL | Status: DC
  Administered 2018-05-21 – 2018-06-02 (×37): 2 mg
  Filled 2018-05-21 (×11): qty 4
  Filled 2018-05-21: qty 16
  Filled 2018-05-21 (×5): qty 4
  Filled 2018-05-21: qty 16
  Filled 2018-05-21 (×19): qty 4

## 2018-05-21 MED ORDER — METOPROLOL TARTRATE 25 MG/10 ML ORAL SUSPENSION
25.0000 mg | Freq: Two times a day (BID) | ORAL | Status: DC
  Administered 2018-05-21 – 2018-06-02 (×25): 25 mg
  Filled 2018-05-21 (×27): qty 10

## 2018-05-21 MED ORDER — PRO-STAT SUGAR FREE PO LIQD
30.0000 mL | Freq: Three times a day (TID) | ORAL | Status: DC
  Administered 2018-05-21 – 2018-05-27 (×18): 30 mL
  Filled 2018-05-21 (×19): qty 30

## 2018-05-21 MED ORDER — QUETIAPINE FUMARATE 200 MG PO TABS
200.0000 mg | ORAL_TABLET | Freq: Three times a day (TID) | ORAL | Status: DC
  Administered 2018-05-21 – 2018-06-01 (×33): 200 mg
  Filled 2018-05-21 (×35): qty 1

## 2018-05-21 MED ORDER — METOPROLOL TARTRATE 5 MG/5ML IV SOLN
5.0000 mg | Freq: Once | INTRAVENOUS | Status: DC
  Filled 2018-05-21: qty 5

## 2018-05-21 MED ORDER — METOPROLOL TARTRATE 25 MG/10 ML ORAL SUSPENSION
12.5000 mg | Freq: Two times a day (BID) | ORAL | Status: DC
  Administered 2018-05-21: 12.5 mg
  Filled 2018-05-21: qty 10

## 2018-05-21 MED ORDER — FUROSEMIDE 10 MG/ML IJ SOLN
40.0000 mg | Freq: Once | INTRAMUSCULAR | Status: AC
  Administered 2018-05-21: 40 mg via INTRAVENOUS
  Filled 2018-05-21: qty 4

## 2018-05-21 MED ORDER — PIVOT 1.5 CAL PO LIQD
1000.0000 mL | ORAL | Status: DC
  Administered 2018-05-21 – 2018-05-27 (×5): 1000 mL
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
## 2018-05-22 ENCOUNTER — Inpatient Hospital Stay (HOSPITAL_COMMUNITY): Payer: Self-pay

## 2018-05-22 LAB — CBC
HCT: 29.3 % — ABNORMAL LOW (ref 39.0–52.0)
HEMOGLOBIN: 9 g/dL — AB (ref 13.0–17.0)
MCH: 28.1 pg (ref 26.0–34.0)
MCHC: 30.7 g/dL (ref 30.0–36.0)
MCV: 91.6 fL (ref 78.0–100.0)
PLATELETS: 537 10*3/uL — AB (ref 150–400)
RBC: 3.2 MIL/uL — AB (ref 4.22–5.81)
RDW: 16.1 % — ABNORMAL HIGH (ref 11.5–15.5)
WBC: 11.4 10*3/uL — ABNORMAL HIGH (ref 4.0–10.5)

## 2018-05-22 LAB — CULTURE, BLOOD (ROUTINE X 2)
CULTURE: NO GROWTH
CULTURE: NO GROWTH
SPECIAL REQUESTS: ADEQUATE
Special Requests: ADEQUATE

## 2018-05-22 LAB — BLOOD GAS, ARTERIAL
ACID-BASE EXCESS: 4.6 mmol/L — AB (ref 0.0–2.0)
BICARBONATE: 28.8 mmol/L — AB (ref 20.0–28.0)
DRAWN BY: 414221
FIO2: 40
MECHVT: 440 mL
O2 Saturation: 96.5 %
PEEP/CPAP: 8 cmH2O
PO2 ART: 88.2 mmHg (ref 83.0–108.0)
Patient temperature: 99
RATE: 22 resp/min
pCO2 arterial: 45 mmHg (ref 32.0–48.0)
pH, Arterial: 7.423 (ref 7.350–7.450)

## 2018-05-22 LAB — BASIC METABOLIC PANEL
Anion gap: 5 (ref 5–15)
BUN: 23 mg/dL — AB (ref 6–20)
CHLORIDE: 106 mmol/L (ref 101–111)
CO2: 29 mmol/L (ref 22–32)
CREATININE: 0.5 mg/dL — AB (ref 0.61–1.24)
Calcium: 7.9 mg/dL — ABNORMAL LOW (ref 8.9–10.3)
Glucose, Bld: 131 mg/dL — ABNORMAL HIGH (ref 65–99)
POTASSIUM: 4.2 mmol/L (ref 3.5–5.1)
SODIUM: 140 mmol/L (ref 135–145)

## 2018-05-22 LAB — PHOSPHORUS: PHOSPHORUS: 4.6 mg/dL (ref 2.5–4.6)

## 2018-05-22 LAB — GLUCOSE, CAPILLARY
GLUCOSE-CAPILLARY: 109 mg/dL — AB (ref 65–99)
GLUCOSE-CAPILLARY: 98 mg/dL (ref 65–99)
Glucose-Capillary: 100 mg/dL — ABNORMAL HIGH (ref 65–99)
Glucose-Capillary: 102 mg/dL — ABNORMAL HIGH (ref 65–99)
Glucose-Capillary: 104 mg/dL — ABNORMAL HIGH (ref 65–99)
Glucose-Capillary: 115 mg/dL — ABNORMAL HIGH (ref 65–99)
Glucose-Capillary: 118 mg/dL — ABNORMAL HIGH (ref 65–99)

## 2018-05-22 LAB — MAGNESIUM: Magnesium: 2.2 mg/dL (ref 1.7–2.4)

## 2018-05-22 MED ORDER — FUROSEMIDE 10 MG/ML IJ SOLN
40.0000 mg | Freq: Once | INTRAMUSCULAR | Status: AC
  Administered 2018-05-22: 40 mg via INTRAVENOUS
  Filled 2018-05-22: qty 4
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
## 2018-05-22 NOTE — Progress Notes (Signed)
Patient placed on wean, CPAP/PS 14/8, at 0805. Patient tolerating well at this time. RT will continue to monitor.

## 2018-05-23 ENCOUNTER — Inpatient Hospital Stay (HOSPITAL_COMMUNITY): Payer: Self-pay

## 2018-05-23 LAB — CBC
HCT: 30.5 % — ABNORMAL LOW (ref 39.0–52.0)
HEMOGLOBIN: 9.4 g/dL — AB (ref 13.0–17.0)
MCH: 28 pg (ref 26.0–34.0)
MCHC: 30.8 g/dL (ref 30.0–36.0)
MCV: 90.8 fL (ref 78.0–100.0)
PLATELETS: 592 10*3/uL — AB (ref 150–400)
RBC: 3.36 MIL/uL — ABNORMAL LOW (ref 4.22–5.81)
RDW: 16.1 % — ABNORMAL HIGH (ref 11.5–15.5)
WBC: 11 10*3/uL — ABNORMAL HIGH (ref 4.0–10.5)

## 2018-05-23 LAB — GLUCOSE, CAPILLARY
GLUCOSE-CAPILLARY: 106 mg/dL — AB (ref 65–99)
Glucose-Capillary: 113 mg/dL — ABNORMAL HIGH (ref 65–99)
Glucose-Capillary: 110 mg/dL — ABNORMAL HIGH (ref 65–99)
Glucose-Capillary: 113 mg/dL — ABNORMAL HIGH (ref 65–99)
Glucose-Capillary: 106 mg/dL — ABNORMAL HIGH (ref 65–99)
Glucose-Capillary: 99 mg/dL (ref 65–99)

## 2018-05-23 LAB — MAGNESIUM: Magnesium: 2.2 mg/dL (ref 1.7–2.4)

## 2018-05-23 LAB — BLOOD GAS, ARTERIAL
Acid-Base Excess: 4.1 mmol/L — ABNORMAL HIGH (ref 0.0–2.0)
Bicarbonate: 28.2 mmol/L — ABNORMAL HIGH (ref 20.0–28.0)
Drawn by: 51702
FIO2: 40
O2 SAT: 97.5 %
PATIENT TEMPERATURE: 99.8
PCO2 ART: 44.4 mmHg (ref 32.0–48.0)
PEEP: 8 cmH2O
PH ART: 7.423 (ref 7.350–7.450)
PO2 ART: 101 mmHg (ref 83.0–108.0)
RATE: 22 resp/min
VT: 440 mL

## 2018-05-23 LAB — BASIC METABOLIC PANEL
Anion gap: 6 (ref 5–15)
BUN: 22 mg/dL — ABNORMAL HIGH (ref 6–20)
CALCIUM: 8.4 mg/dL — AB (ref 8.9–10.3)
CHLORIDE: 105 mmol/L (ref 101–111)
CO2: 29 mmol/L (ref 22–32)
Creatinine, Ser: 0.55 mg/dL — ABNORMAL LOW (ref 0.61–1.24)
Glucose, Bld: 121 mg/dL — ABNORMAL HIGH (ref 65–99)
Potassium: 4.4 mmol/L (ref 3.5–5.1)
SODIUM: 140 mmol/L (ref 135–145)

## 2018-05-23 LAB — PHOSPHORUS: Phosphorus: 4.6 mg/dL (ref 2.5–4.6)

## 2018-05-23 MED ORDER — ENOXAPARIN SODIUM 80 MG/0.8ML ~~LOC~~ SOLN
1.0000 mg/kg | Freq: Two times a day (BID) | SUBCUTANEOUS | Status: DC
  Administered 2018-05-23 – 2018-06-04 (×24): 75 mg via SUBCUTANEOUS
  Filled 2018-05-23 (×9): qty 0.8
  Filled 2018-05-23: qty 0.75
  Filled 2018-05-23 (×3): qty 0.8
  Filled 2018-05-23: qty 0.75
  Filled 2018-05-23: qty 0.8
  Filled 2018-05-23 (×2): qty 0.75
  Filled 2018-05-23 (×3): qty 0.8
  Filled 2018-05-23: qty 0.75
  Filled 2018-05-23 (×5): qty 0.8
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
## 2018-05-24 LAB — BLOOD GAS, ARTERIAL
Acid-Base Excess: 3.4 mmol/L — ABNORMAL HIGH (ref 0.0–2.0)
BICARBONATE: 27.3 mmol/L (ref 20.0–28.0)
DRAWN BY: 51702
FIO2: 40
LHR: 22 {breaths}/min
MECHVT: 440 mL
O2 Saturation: 98.3 %
PCO2 ART: 40.9 mmHg (ref 32.0–48.0)
PEEP/CPAP: 8 cmH2O
PO2 ART: 116 mmHg — AB (ref 83.0–108.0)
Patient temperature: 98.9
pH, Arterial: 7.44 (ref 7.350–7.450)

## 2018-05-24 LAB — GLUCOSE, CAPILLARY
Glucose-Capillary: 88 mg/dL (ref 65–99)
Glucose-Capillary: 116 mg/dL — ABNORMAL HIGH (ref 65–99)
Glucose-Capillary: 109 mg/dL — ABNORMAL HIGH (ref 65–99)

## 2018-05-24 LAB — BASIC METABOLIC PANEL
Anion gap: 7 (ref 5–15)
BUN: 19 mg/dL (ref 6–20)
CHLORIDE: 104 mmol/L (ref 101–111)
CO2: 26 mmol/L (ref 22–32)
Calcium: 8.2 mg/dL — ABNORMAL LOW (ref 8.9–10.3)
Creatinine, Ser: 0.44 mg/dL — ABNORMAL LOW (ref 0.61–1.24)
GFR calc non Af Amer: >60 mL/min (ref >60)
GLUCOSE: 102 mg/dL — AB (ref 65–99)
Potassium: 3.8 mmol/L (ref 3.5–5.1)
Sodium: 137 mmol/L (ref 135–145)

## 2018-05-24 LAB — CBC
HCT: 28.3 % — ABNORMAL LOW (ref 39.0–52.0)
Hemoglobin: 8.8 g/dL — ABNORMAL LOW (ref 13.0–17.0)
MCH: 28.1 pg (ref 26.0–34.0)
MCHC: 31.1 g/dL (ref 30.0–36.0)
MCV: 90.4 fL (ref 78.0–100.0)
Platelets: 512 10*3/uL — ABNORMAL HIGH (ref 150–400)
RBC: 3.13 MIL/uL — AB (ref 4.22–5.81)
RDW: 16.2 % — ABNORMAL HIGH (ref 11.5–15.5)
WBC: 10.3 10*3/uL (ref 4.0–10.5)

## 2018-05-24 LAB — TRIGLYCERIDES: Triglycerides: 88 mg/dL (ref <150)

## 2018-05-24 LAB — MAGNESIUM: MAGNESIUM: 2.1 mg/dL (ref 1.7–2.4)

## 2018-05-24 LAB — PHOSPHORUS: Phosphorus: 4 mg/dL (ref 2.5–4.6)

## 2018-05-24 MED ORDER — PROPOFOL 1000 MG/100ML IV EMUL
5.0000 ug/kg/min | INTRAVENOUS | Status: DC
  Administered 2018-05-24: 10 ug/kg/min via INTRAVENOUS
  Administered 2018-05-25: 20 ug/kg/min via INTRAVENOUS
  Administered 2018-05-25: 15 ug/kg/min via INTRAVENOUS
  Administered 2018-05-26 (×2): 20 ug/kg/min via INTRAVENOUS
  Administered 2018-05-27: 25 ug/kg/min via INTRAVENOUS
  Filled 2018-05-24 (×6): qty 100
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
## 2018-05-24 NOTE — Progress Notes (Signed)
Per Dr. Marin Olphristopher White (surgery) weaned PEEP to 5 and d/c'd mucomyst and duonebs.
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
## 2018-05-25 LAB — COMPREHENSIVE METABOLIC PANEL
ALT: 141 U/L — ABNORMAL HIGH (ref 17–63)
ANION GAP: 7 (ref 5–15)
AST: 87 U/L — AB (ref 15–41)
Albumin: 2 g/dL — ABNORMAL LOW (ref 3.5–5.0)
Alkaline Phosphatase: 160 U/L — ABNORMAL HIGH (ref 38–126)
BILIRUBIN TOTAL: 0.4 mg/dL (ref 0.3–1.2)
BUN: 18 mg/dL (ref 6–20)
CHLORIDE: 107 mmol/L (ref 101–111)
CO2: 28 mmol/L (ref 22–32)
Calcium: 8.4 mg/dL — ABNORMAL LOW (ref 8.9–10.3)
Creatinine, Ser: 0.46 mg/dL — ABNORMAL LOW (ref 0.61–1.24)
GFR calc Af Amer: >60 mL/min (ref >60)
GFR calc non Af Amer: >60 mL/min (ref >60)
Glucose, Bld: 109 mg/dL — ABNORMAL HIGH (ref 65–99)
POTASSIUM: 3.8 mmol/L (ref 3.5–5.1)
SODIUM: 142 mmol/L (ref 135–145)
TOTAL PROTEIN: 6.6 g/dL (ref 6.5–8.1)

## 2018-05-25 LAB — CBC WITH DIFFERENTIAL/PLATELET
Abs Immature Granulocytes: 0.1 10*3/uL (ref 0.0–0.1)
BASOS ABS: 0.1 10*3/uL (ref 0.0–0.1)
BASOS PCT: 1 %
EOS ABS: 0.3 10*3/uL (ref 0.0–0.7)
EOS PCT: 3 %
HCT: 29 % — ABNORMAL LOW (ref 39.0–52.0)
Hemoglobin: 8.9 g/dL — ABNORMAL LOW (ref 13.0–17.0)
IMMATURE GRANULOCYTES: 1 %
LYMPHS ABS: 2.3 10*3/uL (ref 0.7–4.0)
Lymphocytes Relative: 23 %
MCH: 28 pg (ref 26.0–34.0)
MCHC: 30.7 g/dL (ref 30.0–36.0)
MCV: 91.2 fL (ref 78.0–100.0)
Monocytes Absolute: 1.1 10*3/uL — ABNORMAL HIGH (ref 0.1–1.0)
Monocytes Relative: 11 %
NEUTROS PCT: 61 %
Neutro Abs: 5.9 10*3/uL (ref 1.7–7.7)
PLATELETS: 547 10*3/uL — AB (ref 150–400)
RBC: 3.18 MIL/uL — ABNORMAL LOW (ref 4.22–5.81)
RDW: 16.2 % — AB (ref 11.5–15.5)
WBC: 9.6 10*3/uL (ref 4.0–10.5)

## 2018-05-25 LAB — POCT I-STAT 3, ART BLOOD GAS (G3+)
Acid-Base Excess: 2 mmol/L (ref 0.0–2.0)
Bicarbonate: 27.3 mmol/L (ref 20.0–28.0)
O2 Saturation: 99 %
PCO2 ART: 43.4 mmHg (ref 32.0–48.0)
PO2 ART: 130 mmHg — AB (ref 83.0–108.0)
Patient temperature: 98.9
TCO2: 29 mmol/L (ref 22–32)
pH, Arterial: 7.408 (ref 7.350–7.450)

## 2018-05-25 LAB — MAGNESIUM: Magnesium: 2 mg/dL (ref 1.7–2.4)

## 2018-05-25 LAB — PHOSPHORUS: PHOSPHORUS: 4.4 mg/dL (ref 2.5–4.6)
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
## 2018-05-26 ENCOUNTER — Inpatient Hospital Stay (HOSPITAL_COMMUNITY): Payer: Self-pay

## 2018-05-26 LAB — CBC
HEMATOCRIT: 26.8 % — AB (ref 39.0–52.0)
Hemoglobin: 8.4 g/dL — ABNORMAL LOW (ref 13.0–17.0)
MCH: 28.1 pg (ref 26.0–34.0)
MCHC: 31.3 g/dL (ref 30.0–36.0)
MCV: 89.6 fL (ref 78.0–100.0)
Platelets: 472 10*3/uL — ABNORMAL HIGH (ref 150–400)
RBC: 2.99 MIL/uL — AB (ref 4.22–5.81)
RDW: 15.9 % — ABNORMAL HIGH (ref 11.5–15.5)
WBC: 10.2 10*3/uL (ref 4.0–10.5)

## 2018-05-26 LAB — POCT I-STAT 3, ART BLOOD GAS (G3+)
ACID-BASE EXCESS: 3 mmol/L — AB (ref 0.0–2.0)
Bicarbonate: 26.7 mmol/L (ref 20.0–28.0)
O2 SAT: 98 %
PCO2 ART: 34.3 mmHg (ref 32.0–48.0)
PH ART: 7.498 — AB (ref 7.350–7.450)
TCO2: 28 mmol/L (ref 22–32)
pO2, Arterial: 92 mmHg (ref 83.0–108.0)

## 2018-05-26 LAB — BASIC METABOLIC PANEL
ANION GAP: 6 (ref 5–15)
BUN: 16 mg/dL (ref 6–20)
CO2: 24 mmol/L (ref 22–32)
Calcium: 7.6 mg/dL — ABNORMAL LOW (ref 8.9–10.3)
Chloride: 109 mmol/L (ref 98–111)
Creatinine, Ser: 0.37 mg/dL — ABNORMAL LOW (ref 0.61–1.24)
GFR calc Af Amer: >60 mL/min (ref >60)
GLUCOSE: 104 mg/dL — AB (ref 70–99)
POTASSIUM: 3.1 mmol/L — AB (ref 3.5–5.1)
Sodium: 139 mmol/L (ref 135–145)

## 2018-05-26 LAB — MAGNESIUM: Magnesium: 1.9 mg/dL (ref 1.7–2.4)

## 2018-05-26 LAB — PHOSPHORUS: Phosphorus: 3.6 mg/dL (ref 2.5–4.6)

## 2018-05-26 MED ORDER — METOPROLOL TARTRATE 5 MG/5ML IV SOLN
5.0000 mg | Freq: Once | INTRAVENOUS | Status: AC
  Administered 2018-05-26: 5 mg via INTRAVENOUS
  Filled 2018-05-26: qty 5

## 2018-05-26 MED ORDER — POTASSIUM CHLORIDE 10 MEQ/100ML IV SOLN
INTRAVENOUS | Status: AC
  Filled 2018-05-26: qty 100

## 2018-05-26 MED ORDER — POTASSIUM CHLORIDE 10 MEQ/100ML IV SOLN
10.0000 meq | INTRAVENOUS | Status: AC
  Administered 2018-05-26 (×2): 10 meq via INTRAVENOUS

## 2018-05-26 MED ORDER — METOPROLOL TARTRATE 5 MG/5ML IV SOLN
INTRAVENOUS | Status: AC
  Administered 2018-05-26: 5 mg
  Filled 2018-05-26: qty 5

## 2018-05-26 MED ORDER — METOPROLOL TARTRATE 5 MG/5ML IV SOLN
5.0000 mg | Freq: Four times a day (QID) | INTRAVENOUS | Status: DC | PRN
  Administered 2018-05-27 – 2018-05-28 (×3): 5 mg via INTRAVENOUS
  Filled 2018-05-26 (×4): qty 5
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
## 2018-05-27 ENCOUNTER — Encounter (HOSPITAL_COMMUNITY): Payer: Self-pay | Admitting: *Deleted

## 2018-05-27 ENCOUNTER — Inpatient Hospital Stay (HOSPITAL_COMMUNITY): Payer: Self-pay

## 2018-05-27 LAB — BLOOD GAS, ARTERIAL
Acid-Base Excess: 2.5 mmol/L — ABNORMAL HIGH (ref 0.0–2.0)
BICARBONATE: 26 mmol/L (ref 20.0–28.0)
Drawn by: 36277
FIO2: 30
LHR: 22 {breaths}/min
O2 SAT: 95 %
PATIENT TEMPERATURE: 103.9
PCO2 ART: 42 mmHg (ref 32.0–48.0)
PEEP: 5 cmH2O
PH ART: 7.423 (ref 7.350–7.450)
PO2 ART: 86.9 mmHg (ref 83.0–108.0)
VT: 440 mL

## 2018-05-27 LAB — URINALYSIS, ROUTINE W REFLEX MICROSCOPIC
Bacteria, UA: NONE SEEN
Bilirubin Urine: NEGATIVE
Glucose, UA: NEGATIVE mg/dL
Ketones, ur: NEGATIVE mg/dL
Nitrite: NEGATIVE
Protein, ur: 30 mg/dL — AB
SPECIFIC GRAVITY, URINE: 1.017 (ref 1.005–1.030)
WBC, UA: >50 WBC/hpf — ABNORMAL HIGH (ref 0–5)
pH: 5 (ref 5.0–8.0)

## 2018-05-27 LAB — TRIGLYCERIDES: Triglycerides: 397 mg/dL — ABNORMAL HIGH (ref <150)

## 2018-05-27 LAB — BASIC METABOLIC PANEL
Anion gap: 9 (ref 5–15)
BUN: 15 mg/dL (ref 6–20)
CO2: 25 mmol/L (ref 22–32)
CREATININE: 0.55 mg/dL — AB (ref 0.61–1.24)
Calcium: 8.2 mg/dL — ABNORMAL LOW (ref 8.9–10.3)
Chloride: 102 mmol/L (ref 98–111)
GFR calc Af Amer: >60 mL/min (ref >60)
GLUCOSE: 118 mg/dL — AB (ref 70–99)
Potassium: 3.7 mmol/L (ref 3.5–5.1)
Sodium: 136 mmol/L (ref 135–145)

## 2018-05-27 LAB — PHOSPHORUS: Phosphorus: 3.4 mg/dL (ref 2.5–4.6)

## 2018-05-27 LAB — CBC WITH DIFFERENTIAL/PLATELET
BAND NEUTROPHILS: 14 %
BASOS ABS: 0 10*3/uL (ref 0.0–0.1)
BASOS PCT: 0 %
Blasts: 0 %
EOS ABS: 0 10*3/uL (ref 0.0–0.7)
Eosinophils Relative: 0 %
HCT: 31.7 % — ABNORMAL LOW (ref 39.0–52.0)
Hemoglobin: 9.9 g/dL — ABNORMAL LOW (ref 13.0–17.0)
Lymphocytes Relative: 1 %
Lymphs Abs: 0.4 10*3/uL — ABNORMAL LOW (ref 0.7–4.0)
MCH: 28.1 pg (ref 26.0–34.0)
MCHC: 31.2 g/dL (ref 30.0–36.0)
MCV: 90.1 fL (ref 78.0–100.0)
METAMYELOCYTES PCT: 0 %
MONO ABS: 2.9 10*3/uL — AB (ref 0.1–1.0)
MYELOCYTES: 0 %
Monocytes Relative: 7 %
Neutro Abs: 37.6 10*3/uL — ABNORMAL HIGH (ref 1.7–7.7)
Neutrophils Relative %: 78 %
Other: 0 %
PLATELETS: 496 10*3/uL — AB (ref 150–400)
PROMYELOCYTES RELATIVE: 0 %
RBC: 3.52 MIL/uL — ABNORMAL LOW (ref 4.22–5.81)
RDW: 16.3 % — AB (ref 11.5–15.5)
Smear Review: INCREASED
WBC: 40.9 10*3/uL — ABNORMAL HIGH (ref 4.0–10.5)
nRBC: 0 /100 WBC

## 2018-05-27 LAB — C DIFFICILE QUICK SCREEN W PCR REFLEX
C DIFFICILE (CDIFF) INTERP: NOT DETECTED
C DIFFICILE (CDIFF) TOXIN: NEGATIVE
C DIFFICLE (CDIFF) ANTIGEN: NEGATIVE

## 2018-05-27 LAB — MAGNESIUM: Magnesium: 1.7 mg/dL (ref 1.7–2.4)

## 2018-05-27 MED ORDER — IOHEXOL 300 MG/ML  SOLN
100.0000 mL | Freq: Once | INTRAMUSCULAR | Status: AC | PRN
  Administered 2018-05-27: 75 mL via INTRAVENOUS

## 2018-05-27 MED ORDER — VANCOMYCIN HCL IN DEXTROSE 1-5 GM/200ML-% IV SOLN
1000.0000 mg | Freq: Three times a day (TID) | INTRAVENOUS | Status: DC
  Administered 2018-05-28 – 2018-05-30 (×8): 1000 mg via INTRAVENOUS
  Filled 2018-05-27 (×9): qty 200

## 2018-05-27 MED ORDER — PIVOT 1.5 CAL PO LIQD
1000.0000 mL | ORAL | Status: DC
  Administered 2018-05-27 – 2018-06-01 (×5): 1000 mL
  Filled 2018-05-27 (×8): qty 1000

## 2018-05-27 MED ORDER — SODIUM CHLORIDE 0.9 % IV SOLN
1.0000 g | Freq: Three times a day (TID) | INTRAVENOUS | Status: DC
  Administered 2018-05-27 – 2018-05-30 (×9): 1 g via INTRAVENOUS
  Filled 2018-05-27 (×10): qty 1

## 2018-05-27 MED ORDER — DEXMEDETOMIDINE HCL IN NACL 400 MCG/100ML IV SOLN
0.4000 ug/kg/h | INTRAVENOUS | Status: DC
  Administered 2018-05-27 (×2): 0.4 ug/kg/h via INTRAVENOUS
  Administered 2018-05-28: 0.2 ug/kg/h via INTRAVENOUS
  Administered 2018-05-29: 0.5 ug/kg/h via INTRAVENOUS
  Administered 2018-05-29: 0.4 ug/kg/h via INTRAVENOUS
  Administered 2018-05-30 – 2018-05-31 (×3): 0.6 ug/kg/h via INTRAVENOUS
  Administered 2018-05-31: 0.8 ug/kg/h via INTRAVENOUS
  Administered 2018-06-01: 0.9 ug/kg/h via INTRAVENOUS
  Administered 2018-06-01: 0.3 ug/kg/h via INTRAVENOUS
  Administered 2018-06-01: 0.9 ug/kg/h via INTRAVENOUS
  Filled 2018-05-27 (×14): qty 100

## 2018-05-27 MED ORDER — VANCOMYCIN HCL 10 G IV SOLR
1500.0000 mg | Freq: Once | INTRAVENOUS | Status: AC
  Administered 2018-05-27: 1500 mg via INTRAVENOUS
  Filled 2018-05-27: qty 1500

## 2018-05-27 MED ORDER — PRO-STAT SUGAR FREE PO LIQD
30.0000 mL | Freq: Every day | ORAL | Status: DC
  Administered 2018-05-28 – 2018-06-02 (×6): 30 mL
  Filled 2018-05-27 (×6): qty 30
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
## 2018-05-27 NOTE — Progress Notes (Signed)
Paged Dr. Andrey CampanileWilson, on call, regarding pt temp back up to 103.6 Ax. New order to apply cooling blanket and continue Tylenol PRN.
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
## 2018-05-27 NOTE — Social Work (Signed)
CSW f/u.  Pt on vent and sedated.   CSW will continue to follow for disposition at appropriate time.  Keene BreathPatricia Dusty Raczkowski, LCSW Clinical Social Worker [phone]
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
## 2018-05-27 NOTE — Progress Notes (Signed)
RT transported patient on ventilator from room 4N31 to CT and back to room 4N31 with no complications. Vitals are stable. RT will continue to monitor.

## 2018-05-27 NOTE — Progress Notes (Signed)
Follow up - Trauma and Critical Care  Patient Details:    Frederick Molina. is an 32 y.o. male.  Lines/tubes : Airway 7.5 mm (Active)  Secured at (cm) 27 cm 05/27/2018  8:18 AM  Measured From Lips 05/27/2018  8:18 AM  Secured Location Right 05/27/2018  8:18 AM  Secured By [employer] 05/27/2018  8:18 AM  Tube Holder Repositioned Yes 05/27/2018  8:18 AM  Cuff Pressure (cm H2O) 28 cm H2O 05/27/2018  8:18 AM  Site Condition Dry 05/27/2018  8:18 AM     PICC Double Lumen 05/11/18 PICC Left Brachial 44 cm 0 cm (Active)  Indication for Insertion or Continuance of Line Prolonged intravenous therapies 05/26/2018  8:00 PM  Exposed Catheter (cm) 0 cm 05/17/2018  8:00 AM  Site Assessment Clean;Dry;Intact 05/26/2018  8:00 PM  Lumen #1 Status Infusing 05/26/2018  8:00 PM  Lumen #2 Status Infusing;In-line blood sampling system in place 05/26/2018  8:00 PM  Dressing Type Transparent;Occlusive 05/26/2018  8:00 PM  Dressing Status Clean;Dry;Intact;Antimicrobial disc in place 05/26/2018  8:00 PM  Line Care Zeroed and calibrated;Leveled 05/26/2018  8:00 PM  Dressing Intervention New dressing;Dressing changed;Antimicrobial disc changed;Securement device changed 05/25/2018  8:00 PM  Dressing Change Due 06/01/18 05/26/2018  8:00 PM     External Urinary Catheter (Active)  Collection Container Standard drainage bag 05/27/2018  4:00 AM  Securement Method Tape 05/27/2018  4:00 AM  Output (mL) 800 mL 05/27/2018  5:00 AM    Microbiology/Sepsis markers: Results for orders placed or performed during the hospital encounter of 05/01/18  Culture, respiratory (NON-Expectorated)     Status: None   Collection Time: 05/04/18  2:35 PM  Result Value Ref Range Status   Specimen Description TRACHEAL ASPIRATE  Final   Special Requests NONE  Final   Gram Stain   Final    ABUNDANT WBC PRESENT, PREDOMINANTLY PMN RARE SQUAMOUS EPITHELIAL CELLS PRESENT ABUNDANT GRAM NEGATIVE RODS FEW GRAM POSITIVE RODS FEW GRAM POSITIVE  COCCI IN PAIRS IN CLUSTERS Performed at [facility], [place] [address] [place], [place] [address]    Culture MODERATE ENTEROBACTER AEROGENES  Final   Report Status 05/07/2018 FINAL  Final   Organism ID, Bacteria ENTEROBACTER AEROGENES  Final      Susceptibility   Enterobacter aerogenes - MIC*    CEFAZOLIN >=64 RESISTANT Resistant     CEFEPIME <=1 SENSITIVE Sensitive     CEFTAZIDIME <=1 SENSITIVE Sensitive     CEFTRIAXONE <=1 SENSITIVE Sensitive     CIPROFLOXACIN <=0.25 SENSITIVE Sensitive     GENTAMICIN <=1 SENSITIVE Sensitive     IMIPENEM 0.5 SENSITIVE Sensitive     TRIMETH/SULFA <=20 SENSITIVE Sensitive     PIP/TAZO <=4 SENSITIVE Sensitive     * MODERATE ENTEROBACTER AEROGENES  Culture, respiratory (NON-Expectorated)     Status: None   Collection Time: 05/05/18  2:02 PM  Result Value Ref Range Status   Specimen Description TRACHEAL ASPIRATE  Final   Special Requests Normal  Final   Gram Stain   Final    ABUNDANT WBC PRESENT,BOTH PMN AND MONONUCLEAR ABUNDANT GRAM NEGATIVE RODS FEW GRAM POSITIVE COCCI FEW GRAM VARIABLE ROD    Culture   Final    Consistent with normal respiratory flora. Performed at [facility], [place] [address] [place], [place] [address]    Report Status 05/07/2018 FINAL  Final  Culture, bal-quantitative     Status: Abnormal   Collection Time: 05/07/18  4:48 PM  Result Value Ref  Range Status   Specimen Description BRONCHIAL ALVEOLAR LAVAGE  Final   Special Requests NONE  Final   Gram Stain   Final    ABUNDANT WBC PRESENT, PREDOMINANTLY PMN RARE GRAM POSITIVE COCCI RARE GRAM VARIABLE ROD    Culture (A)  Final    20,000 COLONIES/mL Consistent with normal respiratory flora. Performed at [facility], [place] [address], [place], [place] [address]    Report Status 05/10/2018 FINAL  Final  Culture, blood (Routine X 2) w Reflex to ID Panel     Status: None   Collection Time: 05/08/18  1:25 PM  Result Value Ref Range Status   Specimen  Description BLOOD RIGHT WRIST  Final   Special Requests   Final    BOTTLES DRAWN AEROBIC AND ANAEROBIC Blood Culture adequate volume   Culture   Final    NO GROWTH 5 DAYS Performed at [facility], [place] [address], [place], [place] [address]    Report Status 05/13/2018 FINAL  Final  Culture, blood (Routine X 2) w Reflex to ID Panel     Status: None   Collection Time: 05/08/18  1:33 PM  Result Value Ref Range Status   Specimen Description BLOOD RIGHT ANTECUBITAL  Final   Special Requests   Final    BOTTLES DRAWN AEROBIC AND ANAEROBIC Blood Culture adequate volume   Culture   Final    NO GROWTH 5 DAYS Performed at [facility], [place] [address] [place], [place] [address]    Report Status 05/13/2018 FINAL  Final  Culture, Urine     Status: None   Collection Time: 05/08/18  2:28 PM  Result Value Ref Range Status   Specimen Description URINE, CLEAN CATCH  Final   Special Requests NONE  Final   Culture   Final    NO GROWTH Performed at [facility], [place] [address], [place], [place] [address]    Report Status 05/09/2018 FINAL  Final  Culture, respiratory (NON-Expectorated)     Status: None   Collection Time: 05/11/18 11:44 AM  Result Value Ref Range Status   Specimen Description TRACHEAL ASPIRATE  Final   Special Requests NONE  Final   Gram Stain   Final    RARE WBC PRESENT, PREDOMINANTLY PMN RARE SQUAMOUS EPITHELIAL CELLS PRESENT MODERATE GRAM NEGATIVE RODS FEW GRAM POSITIVE COCCI IN PAIRS RARE GRAM POSITIVE RODS Performed at [facility], [place] [address] [place], [place] [address]    Culture MODERATE PSEUDOMONAS AERUGINOSA  Final   Report Status 05/14/2018 FINAL  Final   Organism ID, Bacteria PSEUDOMONAS AERUGINOSA  Final      Susceptibility   Pseudomonas aeruginosa - MIC*    CEFTAZIDIME 4 SENSITIVE Sensitive     CIPROFLOXACIN <=0.25 SENSITIVE Sensitive     GENTAMICIN <=1 SENSITIVE Sensitive     IMIPENEM 2 SENSITIVE Sensitive     PIP/TAZO 16  SENSITIVE Sensitive     CEFEPIME 4 SENSITIVE Sensitive     * MODERATE PSEUDOMONAS AERUGINOSA  Culture, blood (routine x 2)     Status: None   Collection Time: 05/17/18 10:19 AM  Result Value Ref Range Status   Specimen Description BLOOD RIGHT HAND  Final   Special Requests   Final    BOTTLES DRAWN AEROBIC AND ANAEROBIC Blood Culture adequate volume   Culture   Final    NO GROWTH 5 DAYS Performed at [facility], [place] [address], [place], [place] [address]  Report Status 05/22/2018 FINAL  Final  Culture, blood (routine x 2)     Status: None   Collection Time: 05/17/18 11:38 AM  Result Value Ref Range Status   Specimen Description BLOOD RIGHT ANTECUBITAL  Final   Special Requests   Final    BOTTLES DRAWN AEROBIC AND ANAEROBIC Blood Culture adequate volume   Culture   Final    NO GROWTH 5 DAYS Performed at [facility], [address], [place], [place] [address]    Report Status 05/22/2018 FINAL  Final  Culture, respiratory (NON-Expectorated)     Status: None   Collection Time: 05/17/18 11:48 AM  Result Value Ref Range Status   Specimen Description TRACHEAL ASPIRATE  Final   Special Requests NONE  Final   Gram Stain   Final    MODERATE WBC PRESENT, PREDOMINANTLY PMN RARE SQUAMOUS EPITHELIAL CELLS PRESENT FEW GRAM POSITIVE COCCI IN PAIRS Performed at [facility], [place] [address] [place], [place] [address]    Culture FEW PSEUDOMONAS AERUGINOSA  Final   Report Status 05/19/2018 FINAL  Final   Organism ID, Bacteria PSEUDOMONAS AERUGINOSA  Final      Susceptibility   Pseudomonas aeruginosa - MIC*    CEFTAZIDIME 4 SENSITIVE Sensitive     CIPROFLOXACIN <=0.25 SENSITIVE Sensitive     GENTAMICIN <=1 SENSITIVE Sensitive     IMIPENEM 2 SENSITIVE Sensitive     PIP/TAZO 8 SENSITIVE Sensitive     CEFEPIME 4 SENSITIVE Sensitive     * FEW PSEUDOMONAS AERUGINOSA  MRSA PCR Screening     Status: None   Collection Time: 05/18/18  7:35 AM  Result Value Ref Range Status    MRSA by PCR NEGATIVE NEGATIVE Final    Comment:        The GeneXpert MRSA Assay (FDA approved for NASAL specimens only), is one component of a comprehensive MRSA colonization surveillance program. It is not intended to diagnose MRSA infection nor to guide or monitor treatment for MRSA infections. Performed at [facility], [place] [address], [place], [place] [address]   Culture, respiratory (NON-Expectorated)     Status: None   Collection Time: 05/18/18  8:00 AM  Result Value Ref Range Status   Specimen Description TRACHEAL ASPIRATE  Final   Special Requests NONE  Final   Gram Stain   Final    RARE WBC PRESENT,BOTH PMN AND MONONUCLEAR NO ORGANISMS SEEN RARE SQUAMOUS EPITHELIAL CELLS PRESENT    Culture   Final    FEW Consistent with normal respiratory flora. Performed at [facility], [place] [address], [place], [place] [address]    Report Status 05/20/2018 FINAL  Final    Anti-infectives:  Anti-infectives (From admission, onward)   Start     Dose/Rate Route Frequency Ordered Stop   05/18/18 1600  vancomycin (VANCOCIN) IVPB 1000 mg/200 mL premix  Status:  Discontinued     1,000 mg 200 mL/hr over 60 Minutes Intravenous Every 8 hours 05/18/18 0724 05/20/18 0855   05/18/18 0730  vancomycin (VANCOCIN) 1,500 mg in sodium chloride 0.9 % 500 mL IVPB     1,500 mg 250 mL/hr over 120 Minutes Intravenous  Once 05/18/18 0724 05/18/18 1014   05/13/18 1830  vancomycin (VANCOCIN) IVPB 1000 mg/200 mL premix  Status:  Discontinued     1,000 mg 200 mL/hr over 60 Minutes Intravenous Every 8 hours 05/13/18 0936 05/14/18 0910   05/13/18 1030  vancomycin (VANCOCIN) 1,500 mg in sodium chloride 0.9 %  500 mL IVPB     1,500 mg 250 mL/hr over 120 Minutes Intravenous  Once 05/13/18 0936 05/13/18 1318   05/13/18 0945  meropenem (MERREM) 1 g in sodium chloride 0.9 % 100 mL IVPB  Status:  Discontinued     1 g 200 mL/hr over 30 Minutes Intravenous Every 8 hours 05/13/18 0936 05/21/18 0842    05/08/18 1700  vancomycin (VANCOCIN) IVPB 1000 mg/200 mL premix  Status:  Discontinued     1,000 mg 200 mL/hr over 60 Minutes Intravenous Every 8 hours 05/08/18 0802 05/09/18 1412   05/08/18 0815  ceFEPIme (MAXIPIME) 2 g in sodium chloride 0.9 % 100 mL IVPB  Status:  Discontinued     2 g 200 mL/hr over 30 Minutes Intravenous Every 8 hours 05/08/18 0803 05/13/18 0914   05/08/18 0800  vancomycin (VANCOCIN) 1,750 mg in sodium chloride 0.9 % 500 mL IVPB     1,750 mg 250 mL/hr over 120 Minutes Intravenous  Once 05/08/18 0802 05/08/18 1200   05/05/18 0900  ceFEPIme (MAXIPIME) 2 g in sodium chloride 0.9 % 100 mL IVPB  Status:  Discontinued     2 g 200 mL/hr over 30 Minutes Intravenous Every 12 hours 05/05/18 0755 05/08/18 0803   05/02/18 0600  ceFAZolin (ANCEF) IVPB 1 g/50 mL premix  Status:  Discontinued     1 g 100 mL/hr over 30 Minutes Intravenous Every 8 hours 05/02/18 0317 05/05/18 0755   05/02/18 0055  bacitracin 50,000 Units in sodium chloride 0.9 % 500 mL irrigation  Status:  Discontinued       As needed 05/02/18 0055 05/02/18 0209   05/01/18 2330  ceFAZolin (ANCEF) IVPB 2g/100 mL premix     2 g 200 mL/hr over 30 Minutes Intravenous  Once 05/01/18 2317 05/01/18 2355      Best Practice/Protocols:  VTE Prophylaxis: Lovenox (full dose) GI Prophylaxis: Proton Pump Inhibitor Continous Sedation  Consults: Treatment Team:  Jovita Gamma, MD    Events:  Subjective:    Overnight Issues: Spking fevers.  Objective:  Vital signs for last 24 hours: Temp:  [99.1 F (37.3 C)-104.4 F (40.2 C)] 102.2 F (39 C) (06/26 0700) Pulse Rate:  [89-147] 134 (06/26 0813) Resp:  [12-37] 33 (06/26 0813) BP: (86-168)/(38-124) 134/80 (06/26 0813) SpO2:  [97 %-100 %] 100 % (06/26 0830) FiO2 (%):  [30 %-40 %] 30 % (06/26 0830) Weight:  [73.3 kg (161 lb 9.6 oz)] 73.3 kg (161 lb 9.6 oz) (06/26 0500)  Hemodynamic parameters for last 24 hours: CVP:  [4 mmHg-6 mmHg] 4 mmHg  Intake/Output from  previous day: 06/25 0701 - 06/26 0700 In: 4420.9 [I.V.:2410.8; NG/GT:1900; IV Piggyback:110.1] Out: 3100 [Urine:2250; Emesis/NG output:850]  Intake/Output this shift: No intake/output data recorded.  Vent settings for last 24 hours: Vent Mode: PRVC FiO2 (%):  [30 %-40 %] 30 % Set Rate:  [22 bmp] 22 bmp Vt Set:  [440 mL] 440 mL PEEP:  [5 cmH20] 5 cmH20 Pressure Support:  [5 cmH20] 5 cmH20 Plateau Pressure:  [15 cmH20] 15 cmH20  Physical Exam:  General: alert, no respiratory distress and eyes open and seems to be connected Neuro: alert and RASS 0 Resp: clear to auscultation bilaterally and Oxygenation is good and CXR from yesterday is good. CVS: Sinus tachycardia into the 140's GI: soft, nontender, BS WNL, no r/g and tolerating tube feedings well. Extremities: no edema, no erythema, pulses WNL  Results for orders placed or performed during the hospital encounter of 05/01/18 (from the past  24 hour(s))  Triglycerides     Status: Abnormal   Collection Time: 05/26/18 11:20 PM  Result Value Ref Range   Triglycerides 397 (H) <150 mg/dL  Blood gas, arterial     Status: Abnormal   Collection Time: 05/27/18  2:08 AM  Result Value Ref Range   FIO2 30.00    Delivery systems VENTILATOR    Mode PRESSURE REGULATED VOLUME CONTROL    VT 440 mL   LHR 22 resp/min   Peep/cpap 5.0 cm H20   pH, Arterial 7.423 7.350 - 7.450   pCO2 arterial 42.0 32.0 - 48.0 mmHg   pO2, Arterial 86.9 83.0 - 108.0 mmHg   Bicarbonate 26.0 20.0 - 28.0 mmol/L   Acid-Base Excess 2.5 (H) 0.0 - 2.0 mmol/L   O2 Saturation 95.0 %   Patient temperature 103.9    Collection site RIGHT RADIAL    Drawn by [phone]    Sample type ARTERIAL    Allens test (pass/fail) PASS PASS  Magnesium     Status: None   Collection Time: 05/27/18  5:00 AM  Result Value Ref Range   Magnesium 1.7 1.7 - 2.4 mg/dL  Phosphorus     Status: None   Collection Time: 05/27/18  5:00 AM  Result Value Ref Range   Phosphorus 3.4 2.5 - 4.6 mg/dL      Assessment/Plan:   NEURO  Altered Mental Status:  agitation, delirium and sedation   Plan: Will allow the patient to awken more with move towards extubation.  PULM  No specific issues   Plan: Wean towards extubation  CARDIO  Sinus Tachycardia   Plan: Seems to b e related to fever  RENAL  Urine output and renal function are good.   Plan: Will check urine for possible UTI  GI  No specific  issues   Plan: No specific issues  ID  Fevers of unknown etiology.  Will reculture everything   Plan: Will not start empiric antibiotics yet.  Recheck WBC  HEME  Rechecking labs also.   Plan: Recheck CBC  ENDO No specific  issues   Plan: CPM  Global Issues  CT scan of the head with and without contrast.  FUO, will reculture everything, but will not start antibiotics yet.  Recheckin CBC.  Pan culture.  Possible extubatiions in the near future    LOS: 25 days   Additional comments:I reviewed the patient's new clinical lab test results. all labs are pending. and I have discussed and reviewed with family members patient's Mother at the bedside.  Critical Care Total Time*: 30 Minutes  Judeth Horn 05/27/2018  *Care during the described time interval was provided by me and/or other providers on the critical care team.  I have reviewed this patient's available data, including medical history, events of note, physical examination and test results as part of my evaluation.

## 2018-05-28 LAB — CBC WITH DIFFERENTIAL/PLATELET
BASOS ABS: 0 10*3/uL (ref 0.0–0.1)
Basophils Relative: 0 %
Eosinophils Absolute: 0 10*3/uL (ref 0.0–0.7)
Eosinophils Relative: 0 %
HEMATOCRIT: 28.4 % — AB (ref 39.0–52.0)
HEMOGLOBIN: 8.8 g/dL — AB (ref 13.0–17.0)
LYMPHS PCT: 4 %
Lymphs Abs: 1.4 10*3/uL (ref 0.7–4.0)
MCH: 27.8 pg (ref 26.0–34.0)
MCHC: 31 g/dL (ref 30.0–36.0)
MCV: 89.6 fL (ref 78.0–100.0)
Monocytes Absolute: 2.1 10*3/uL — ABNORMAL HIGH (ref 0.1–1.0)
Monocytes Relative: 6 %
NEUTROS ABS: 32.2 10*3/uL — AB (ref 1.7–7.7)
Neutrophils Relative %: 90 %
Platelets: 413 10*3/uL — ABNORMAL HIGH (ref 150–400)
RBC: 3.17 MIL/uL — AB (ref 4.22–5.81)
RDW: 16 % — ABNORMAL HIGH (ref 11.5–15.5)
Smear Review: ADEQUATE
WBC: 35.7 10*3/uL — ABNORMAL HIGH (ref 4.0–10.5)

## 2018-05-28 LAB — BLOOD GAS, ARTERIAL
ACID-BASE EXCESS: 1.9 mmol/L (ref 0.0–2.0)
BICARBONATE: 25.7 mmol/L (ref 20.0–28.0)
Drawn by: 51702
FIO2: 30
LHR: 22 {breaths}/min
O2 SAT: 97.5 %
PATIENT TEMPERATURE: 101
PEEP/CPAP: 5 cmH2O
PO2 ART: 101 mmHg (ref 83.0–108.0)
VT: 440 mL
pCO2 arterial: 40.6 mmHg (ref 32.0–48.0)
pH, Arterial: 7.424 (ref 7.350–7.450)

## 2018-05-28 LAB — BLOOD CULTURE ID PANEL (REFLEXED)
ACINETOBACTER BAUMANNII: NOT DETECTED
CANDIDA ALBICANS: NOT DETECTED
CANDIDA KRUSEI: NOT DETECTED
CANDIDA PARAPSILOSIS: NOT DETECTED
Candida glabrata: NOT DETECTED
Candida tropicalis: NOT DETECTED
ENTEROBACTER CLOACAE COMPLEX: NOT DETECTED
ENTEROBACTERIACEAE SPECIES: NOT DETECTED
ESCHERICHIA COLI: NOT DETECTED
Enterococcus species: NOT DETECTED
Haemophilus influenzae: NOT DETECTED
KLEBSIELLA OXYTOCA: NOT DETECTED
Klebsiella pneumoniae: NOT DETECTED
Listeria monocytogenes: NOT DETECTED
METHICILLIN RESISTANCE: DETECTED — AB
Neisseria meningitidis: NOT DETECTED
PSEUDOMONAS AERUGINOSA: NOT DETECTED
Proteus species: NOT DETECTED
STREPTOCOCCUS PNEUMONIAE: NOT DETECTED
STREPTOCOCCUS PYOGENES: NOT DETECTED
Serratia marcescens: NOT DETECTED
Staphylococcus aureus (BCID): NOT DETECTED
Staphylococcus species: DETECTED — AB
Streptococcus agalactiae: NOT DETECTED
Streptococcus species: NOT DETECTED

## 2018-05-28 LAB — BASIC METABOLIC PANEL
ANION GAP: 8 (ref 5–15)
BUN: 17 mg/dL (ref 6–20)
CHLORIDE: 103 mmol/L (ref 98–111)
CO2: 25 mmol/L (ref 22–32)
Calcium: 7.7 mg/dL — ABNORMAL LOW (ref 8.9–10.3)
Creatinine, Ser: 0.43 mg/dL — ABNORMAL LOW (ref 0.61–1.24)
GFR calc non Af Amer: >60 mL/min (ref >60)
Glucose, Bld: 114 mg/dL — ABNORMAL HIGH (ref 70–99)
Potassium: 3.6 mmol/L (ref 3.5–5.1)
Sodium: 136 mmol/L (ref 135–145)

## 2018-05-28 LAB — PHOSPHORUS: Phosphorus: 2.6 mg/dL (ref 2.5–4.6)

## 2018-05-28 LAB — MAGNESIUM: Magnesium: 1.9 mg/dL (ref 1.7–2.4)
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
## 2018-05-29 ENCOUNTER — Inpatient Hospital Stay (HOSPITAL_COMMUNITY): Payer: Self-pay

## 2018-05-29 LAB — BASIC METABOLIC PANEL
Anion gap: 7 (ref 5–15)
BUN: 15 mg/dL (ref 6–20)
CO2: 27 mmol/L (ref 22–32)
Calcium: 8.4 mg/dL — ABNORMAL LOW (ref 8.9–10.3)
Chloride: 103 mmol/L (ref 98–111)
Creatinine, Ser: 0.48 mg/dL — ABNORMAL LOW (ref 0.61–1.24)
GFR calc Af Amer: >60 mL/min (ref >60)
GLUCOSE: 106 mg/dL — AB (ref 70–99)
Potassium: 4.4 mmol/L (ref 3.5–5.1)
Sodium: 137 mmol/L (ref 135–145)

## 2018-05-29 LAB — CULTURE, RESPIRATORY: CULTURE: NORMAL

## 2018-05-29 LAB — CBC
HCT: 32 % — ABNORMAL LOW (ref 39.0–52.0)
Hemoglobin: 9.7 g/dL — ABNORMAL LOW (ref 13.0–17.0)
MCH: 27.4 pg (ref 26.0–34.0)
MCHC: 30.3 g/dL (ref 30.0–36.0)
MCV: 90.4 fL (ref 78.0–100.0)
PLATELETS: 396 10*3/uL (ref 150–400)
RBC: 3.54 MIL/uL — ABNORMAL LOW (ref 4.22–5.81)
RDW: 16.5 % — AB (ref 11.5–15.5)
WBC: 12.5 10*3/uL — ABNORMAL HIGH (ref 4.0–10.5)

## 2018-05-29 LAB — MAGNESIUM: Magnesium: 1.9 mg/dL (ref 1.7–2.4)

## 2018-05-29 LAB — PHOSPHORUS: Phosphorus: 3.7 mg/dL (ref 2.5–4.6)

## 2018-05-29 MED ORDER — WHITE PETROLATUM EX OINT
TOPICAL_OINTMENT | CUTANEOUS | Status: AC
  Filled 2018-05-29: qty 28.35

## 2018-05-29 NOTE — Procedures (Signed)
Extubation Procedure Note  Patient Details:   Name: Frederick ForesterSteven Brinson Jr. DOB: 1986/03/22 MRN: [id number]   Airway Documentation:    Vent end date: 05/29/18 Vent end time: 0914   Evaluation  O2 sats: stable throughout Complications: No apparent complications Patient did tolerate procedure well. Bilateral Breath Sounds: Clear, Diminished   No   Positive cuff leak noted. Pt placed on [place] 4 L with humidity, no stridor noted.  Pt has a weak cough, and is able to reach 250 mL using incentive spirometer with coaching.  Rayburn FeltJean S Man Effertz 05/29/2018, 9:23 AM
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
## 2018-05-30 LAB — URINE CULTURE

## 2018-05-30 LAB — VANCOMYCIN, TROUGH: Vancomycin Tr: 10 ug/mL — ABNORMAL LOW (ref 15–20)

## 2018-05-30 LAB — CBC
HEMATOCRIT: 35.1 % — AB (ref 39.0–52.0)
Hemoglobin: 10.4 g/dL — ABNORMAL LOW (ref 13.0–17.0)
MCH: 27.5 pg (ref 26.0–34.0)
MCHC: 29.6 g/dL — ABNORMAL LOW (ref 30.0–36.0)
MCV: 92.9 fL (ref 78.0–100.0)
Platelets: 365 10*3/uL (ref 150–400)
RBC: 3.78 MIL/uL — AB (ref 4.22–5.81)
RDW: 16.7 % — AB (ref 11.5–15.5)
WBC: 8.1 10*3/uL (ref 4.0–10.5)

## 2018-05-30 LAB — CULTURE, BLOOD (ROUTINE X 2)

## 2018-05-30 LAB — PHOSPHORUS: Phosphorus: 3.4 mg/dL (ref 2.5–4.6)

## 2018-05-30 LAB — BASIC METABOLIC PANEL
ANION GAP: 14 (ref 5–15)
BUN: 9 mg/dL (ref 6–20)
CO2: 21 mmol/L — AB (ref 22–32)
Calcium: 8.1 mg/dL — ABNORMAL LOW (ref 8.9–10.3)
Chloride: 106 mmol/L (ref 98–111)
Creatinine, Ser: 0.49 mg/dL — ABNORMAL LOW (ref 0.61–1.24)
GFR calc Af Amer: >60 mL/min (ref >60)
GFR calc non Af Amer: >60 mL/min (ref >60)
Glucose, Bld: 95 mg/dL (ref 70–99)
POTASSIUM: 4.3 mmol/L (ref 3.5–5.1)
Sodium: 141 mmol/L (ref 135–145)

## 2018-05-30 LAB — MAGNESIUM: Magnesium: 1.9 mg/dL (ref 1.7–2.4)

## 2018-05-30 MED ORDER — CEFTAZIDIME 1 G IJ SOLR
1.0000 g | Freq: Three times a day (TID) | INTRAMUSCULAR | Status: AC
  Administered 2018-05-30 – 2018-06-03 (×14): 1 g via INTRAVENOUS
  Filled 2018-05-30 (×18): qty 1

## 2018-05-30 MED ORDER — VANCOMYCIN HCL 10 G IV SOLR
1250.0000 mg | Freq: Three times a day (TID) | INTRAVENOUS | Status: AC
  Administered 2018-05-30 – 2018-06-03 (×13): 1250 mg via INTRAVENOUS
  Filled 2018-05-30 (×13): qty 1250

## 2018-05-30 MED ORDER — CHLORHEXIDINE GLUCONATE 0.12 % MT SOLN
OROMUCOSAL | Status: AC
  Administered 2018-05-30: 10:00:00
  Filled 2018-05-30: qty 15
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
## 2018-05-31 LAB — MAGNESIUM: Magnesium: 2 mg/dL (ref 1.7–2.4)

## 2018-05-31 LAB — PHOSPHORUS: Phosphorus: 3.7 mg/dL (ref 2.5–4.6)

## 2018-05-31 MED ORDER — LOPERAMIDE HCL 1 MG/7.5ML PO SUSP
2.0000 mg | ORAL | Status: DC | PRN
  Administered 2018-05-31 (×2): 2 mg
  Filled 2018-05-31 (×4): qty 15

## 2018-05-31 NOTE — Progress Notes (Signed)
Overall stable.  Patient somnolent but will awaken to stimulation.  Nonverbal.  He will follow commands with great prompting.  Exam otherwise stable.  Wounds look good.  Continue supportive care.  No new recommendations.

## 2018-05-31 NOTE — Progress Notes (Signed)
SLP Cancellation Note  Patient Details Name: Frederick ForesterSteven Fiscus Jr. MRN: [id number] DOB: 10/14/1986   Cancelled treatment:       Reason Eval/Treat Not Completed: Fatigue/lethargy limiting ability to participate. SLP will follow up next date.  Rondel BatonMary Beth Dezra Mandella, [place]MS, CCC-SLP Speech-Language Pathologist [phone]  Frederick Molina 05/31/2018, 4:52 PM

## 2018-05-31 NOTE — Progress Notes (Signed)
Pt will follow commands and is much more alert than he has been, but he is still very confused/delusional.  Throughout the night he pulled off his condom catheter 5 times, nasal cannula 3 times and almost pulled out an IV as well. Pt also continues to have type 7 stools consistently with a rectal pouch not helping.

## 2018-05-31 NOTE — Progress Notes (Addendum)
Trauma Service Note  Subjective: Patient has been following command for the nurses, but not for  Me today.  Sleepy this AM  Objective: Vital signs in last 24 hours: Temp:  [98.5 F (36.9 C)-101.2 F (38.4 C)] 98.5 F (36.9 C) (06/30 0800) Pulse Rate:  [76-100] 82 (06/30 1000) Resp:  [13-35] 15 (06/30 1000) BP: (109-146)/(65-95) 135/82 (06/30 1000) SpO2:  [96 %-100 %] 99 % (06/30 1000) Weight:  [78.3 kg (172 lb 9.9 oz)] 78.3 kg (172 lb 9.9 oz) (06/30 0500) Last BM Date: 05/31/18  Intake/Output from previous day: 06/29 0701 - 06/30 0700 In: 5025.1 [I.V.:2124.7; NG/GT:1560; IV Piggyback:1340.4] Out: 4690 [Urine:4190; Stool:500] Intake/Output this shift: Total I/O In: 459.1 [I.V.:264.1; NG/GT:195] Out: 140 [Urine:140]  General: No distress.  Father at the bedside, I gave him information about current plan.  Lungs: Clear.  Oxygenating well.   Abd: Soft, good bowel sounds.  Having a lot of diarrhea.  C.diff negative last week.  Extremities: No changes.  He is on therapeutic Lovenox for a popliteal clot  Neuro: Somnolent this AM.    Micro:  On Fortaz and Vanco for fever and leukocytosis.  WBC down to 8.1 yesterday.    Lab Results: CBC  Recent Labs    05/29/18 0435 05/30/18 0239  WBC 12.5* 8.1  HGB 9.7* 10.4*  HCT 32.0* 35.1*  PLT 396 365   BMET Recent Labs    05/29/18 0435 05/30/18 0239  NA 137 141  K 4.4 4.3  CL 103 106  CO2 27 21*  GLUCOSE 106* 95  BUN 15 9  CREATININE 0.48* 0.49*  CALCIUM 8.4* 8.1*   PT/INR No results for input(s): LABPROT, INR in the last 72 hours. ABG No results for input(s): PHART, HCO3 in the last 72 hours.  Invalid input(s): PCO2, PO2  Studies/Results: No results found.  Anti-infectives: Anti-infectives (From admission, onward)   Start     Dose/Rate Route Frequency Ordered Stop   05/30/18 1730  vancomycin (VANCOCIN) 1,250 mg in sodium chloride 0.9 % 250 mL IVPB     1,250 mg 166.7 mL/hr over 90 Minutes Intravenous Every 8  hours 05/30/18 1729     05/30/18 1100  cefTAZidime (FORTAZ) 1 g in sodium chloride 0.9 % 100 mL IVPB     1 g 200 mL/hr over 30 Minutes Intravenous Every 8 hours 05/30/18 0930     05/28/18 0100  vancomycin (VANCOCIN) IVPB 1000 mg/200 mL premix  Status:  Discontinued     1,000 mg 200 mL/hr over 60 Minutes Intravenous Every 8 hours 05/27/18 1728 05/30/18 1729   05/27/18 1545  vancomycin (VANCOCIN) 1,500 mg in sodium chloride 0.9 % 500 mL IVPB     1,500 mg 250 mL/hr over 120 Minutes Intravenous  Once 05/27/18 1510 05/27/18 1842   05/27/18 1530  meropenem (MERREM) 1 g in sodium chloride 0.9 % 100 mL IVPB  Status:  Discontinued     1 g 200 mL/hr over 30 Minutes Intravenous Every 8 hours 05/27/18 1519 05/30/18 0930   05/18/18 1600  vancomycin (VANCOCIN) IVPB 1000 mg/200 mL premix  Status:  Discontinued     1,000 mg 200 mL/hr over 60 Minutes Intravenous Every 8 hours 05/18/18 0724 05/20/18 0855   05/18/18 0730  vancomycin (VANCOCIN) 1,500 mg in sodium chloride 0.9 % 500 mL IVPB     1,500 mg 250 mL/hr over 120 Minutes Intravenous  Once 05/18/18 0724 05/18/18 1014   05/13/18 1830  vancomycin (VANCOCIN) IVPB 1000 mg/200 mL premix  Status:  Discontinued     1,000 mg 200 mL/hr over 60 Minutes Intravenous Every 8 hours 05/13/18 0936 05/14/18 0910   05/13/18 1030  vancomycin (VANCOCIN) 1,500 mg in sodium chloride 0.9 % 500 mL IVPB     1,500 mg 250 mL/hr over 120 Minutes Intravenous  Once 05/13/18 0936 05/13/18 1318   05/13/18 0945  meropenem (MERREM) 1 g in sodium chloride 0.9 % 100 mL IVPB  Status:  Discontinued     1 g 200 mL/hr over 30 Minutes Intravenous Every 8 hours 05/13/18 0936 05/21/18 0842   05/08/18 1700  vancomycin (VANCOCIN) IVPB 1000 mg/200 mL premix  Status:  Discontinued     1,000 mg 200 mL/hr over 60 Minutes Intravenous Every 8 hours 05/08/18 0802 05/09/18 1412   05/08/18 0815  ceFEPIme (MAXIPIME) 2 g in sodium chloride 0.9 % 100 mL IVPB  Status:  Discontinued     2 g 200 mL/hr  over 30 Minutes Intravenous Every 8 hours 05/08/18 0803 05/13/18 0914   05/08/18 0800  vancomycin (VANCOCIN) 1,750 mg in sodium chloride 0.9 % 500 mL IVPB     1,750 mg 250 mL/hr over 120 Minutes Intravenous  Once 05/08/18 0802 05/08/18 1200   05/05/18 0900  ceFEPIme (MAXIPIME) 2 g in sodium chloride 0.9 % 100 mL IVPB  Status:  Discontinued     2 g 200 mL/hr over 30 Minutes Intravenous Every 12 hours 05/05/18 0755 05/08/18 0803   05/02/18 0600  ceFAZolin (ANCEF) IVPB 1 g/50 mL premix  Status:  Discontinued     1 g 100 mL/hr over 30 Minutes Intravenous Every 8 hours 05/02/18 0317 05/05/18 0755   05/02/18 0055  bacitracin 50,000 Units in sodium chloride 0.9 % 500 mL irrigation  Status:  Discontinued       As needed 05/02/18 0055 05/02/18 0209   05/01/18 2330  ceFAZolin (ANCEF) IVPB 2g/100 mL premix     2 g 200 mL/hr over 30 Minutes Intravenous  Once 05/01/18 2317 05/01/18 2355      Assessment/Plan: s/p Procedure(s): CRANIECTOMY AND REPAIR OF SCALP LACERATIONS Imodium for his diarrhea.  Will consult TBI tean.  LOS: 29 days   Marta LamasJames O. Gae BonWyatt, III, MD, FACS [phone] Trauma Surgeon 05/31/2018

## 2018-06-01 LAB — CBC WITH DIFFERENTIAL/PLATELET
ABS IMMATURE GRANULOCYTES: 0.1 10*3/uL (ref 0.0–0.1)
BASOS ABS: 0 10*3/uL (ref 0.0–0.1)
BASOS PCT: 0 %
EOS ABS: 0.2 10*3/uL (ref 0.0–0.7)
Eosinophils Relative: 2 %
HCT: 30.6 % — ABNORMAL LOW (ref 39.0–52.0)
Hemoglobin: 9.7 g/dL — ABNORMAL LOW (ref 13.0–17.0)
IMMATURE GRANULOCYTES: 1 %
Lymphocytes Relative: 39 %
Lymphs Abs: 3.2 10*3/uL (ref 0.7–4.0)
MCH: 27.5 pg (ref 26.0–34.0)
MCHC: 31.7 g/dL (ref 30.0–36.0)
MCV: 86.7 fL (ref 78.0–100.0)
MONOS PCT: 12 %
Monocytes Absolute: 1 10*3/uL (ref 0.1–1.0)
NEUTROS PCT: 46 %
Neutro Abs: 3.8 10*3/uL (ref 1.7–7.7)
PLATELETS: 353 10*3/uL (ref 150–400)
RBC: 3.53 MIL/uL — AB (ref 4.22–5.81)
RDW: 16.4 % — AB (ref 11.5–15.5)
WBC: 8.3 10*3/uL (ref 4.0–10.5)

## 2018-06-01 LAB — COMPREHENSIVE METABOLIC PANEL
ALBUMIN: 1.9 g/dL — AB (ref 3.5–5.0)
ALT: 149 U/L — AB (ref 0–44)
AST: 143 U/L — AB (ref 15–41)
Alkaline Phosphatase: 112 U/L (ref 38–126)
Anion gap: 7 (ref 5–15)
BUN: 11 mg/dL (ref 6–20)
CHLORIDE: 110 mmol/L (ref 98–111)
CO2: 24 mmol/L (ref 22–32)
CREATININE: 0.44 mg/dL — AB (ref 0.61–1.24)
Calcium: 8 mg/dL — ABNORMAL LOW (ref 8.9–10.3)
GFR calc Af Amer: >60 mL/min (ref >60)
GLUCOSE: 128 mg/dL — AB (ref 70–99)
POTASSIUM: 3.5 mmol/L (ref 3.5–5.1)
SODIUM: 141 mmol/L (ref 135–145)
Total Bilirubin: 0.7 mg/dL (ref 0.3–1.2)
Total Protein: 6 g/dL — ABNORMAL LOW (ref 6.5–8.1)

## 2018-06-01 LAB — PHOSPHORUS: Phosphorus: 4.3 mg/dL (ref 2.5–4.6)

## 2018-06-01 LAB — CULTURE, BLOOD (ROUTINE X 2): Culture: NO GROWTH

## 2018-06-01 LAB — MAGNESIUM: MAGNESIUM: 2.1 mg/dL (ref 1.7–2.4)

## 2018-06-01 MED ORDER — OXYCODONE HCL 5 MG/5ML PO SOLN: 10.0000 mg | ORAL | Status: DC | PRN

## 2018-06-01 MED ORDER — ACETAMINOPHEN 160 MG/5ML PO SOLN: 650.0000 mg | Freq: Four times a day (QID) | ORAL | Status: DC | PRN

## 2018-06-01 MED ORDER — QUETIAPINE FUMARATE 200 MG PO TABS: 200.0000 mg | ORAL_TABLET | Freq: Every day | ORAL | Status: DC

## 2018-06-01 MED ORDER — SODIUM CHLORIDE 0.9 % IV SOLN
INTRAVENOUS | Status: DC
  Administered 2018-06-01: 15:00:00 via INTRAVENOUS

## 2018-06-01 MED ORDER — SODIUM CHLORIDE 0.9 % IV SOLN
12.5000 mg | Freq: Once | INTRAVENOUS | Status: AC
  Administered 2018-06-01: 12.5 mg via INTRAVENOUS
  Filled 2018-06-01: qty 0.5

## 2018-06-01 NOTE — Progress Notes (Signed)
Rehab Admissions Coordinator Note:  Patient was screened by Clois DupesBoyette, Pearlena Ow Godwin for appropriateness for an Inpatient Acute Rehab Consult per PT recommendation.  At this time, we are recommending Inpatient Rehab consult if pt would like to be considered for admit. Please advise.  Clois DupesBoyette, Melissa Pulido Godwin 06/01/2018, 1:06 PM  I can be reached at [phone].
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
## 2018-06-01 NOTE — Progress Notes (Signed)
Subjective: Patient resting in bed comfortably.  Extubated successfully.  PT, OT, ST initiated.  Objective: Vital signs in last 24 hours: Vitals:   06/01/18 1400 06/01/18 1500 06/01/18 1600 06/01/18 1700  BP: 135/84 123/79 127/83 113/86  Pulse: 87 83 79 92  Resp: (!) 21 (!) 30 (!) 24 (!) 38  Temp:   97.8 F (36.6 C)   TempSrc:   Oral   SpO2: 98% 100% 99% 97%  Weight:      Height:        Intake/Output from previous day: 06/30 0701 - 07/01 0700 In: 4620.7 [I.V.:1549.8; NG/GT:1560; IV Piggyback:1510.9] Out: 2450 [Urine:2450] Intake/Output this shift: Total I/O In: 1564.9 [I.V.:564.8; NG/GT:650; IV Piggyback:350.1] Out: 3075 [Urine:3075]  Physical Exam: Awake and alert, following commands briskly.  Moving all 4 extremities well.  Laceration healing well.  CBC Recent Labs    05/30/18 0239 06/01/18 0243  WBC 8.1 8.3  HGB 10.4* 9.7*  HCT 35.1* 30.6*  PLT 365 353   BMET Recent Labs    05/30/18 0239 06/01/18 0243  NA 141 141  K 4.3 3.5  CL 106 110  CO2 21* 24  GLUCOSE 95 128*  BUN 9 11  CREATININE 0.49* 0.44*  CALCIUM 8.1* 8.0*    Assessment/Plan: Agree with recommendation for CIR.  Stable from a neurosurgical perspective.  Case discussed with Dr. Violeta GelinasBurke Thompson.   Hewitt ShortsNUDELMAN,ROBERT W, MD 06/01/2018, 5:49 PM
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
## 2018-06-02 ENCOUNTER — Encounter (HOSPITAL_COMMUNITY): Payer: Self-pay | Admitting: Physical Medicine and Rehabilitation

## 2018-06-02 DIAGNOSIS — I82493 Acute embolism and thrombosis of other specified deep vein of lower extremity, bilateral: Secondary | ICD-10-CM

## 2018-06-02 DIAGNOSIS — S2249XA Multiple fractures of ribs, unspecified side, initial encounter for closed fracture: Secondary | ICD-10-CM

## 2018-06-02 DIAGNOSIS — R0682 Tachypnea, not elsewhere classified: Secondary | ICD-10-CM

## 2018-06-02 DIAGNOSIS — S0291XB Unspecified fracture of skull, initial encounter for open fracture: Secondary | ICD-10-CM

## 2018-06-02 DIAGNOSIS — F1010 Alcohol abuse, uncomplicated: Secondary | ICD-10-CM

## 2018-06-02 DIAGNOSIS — S299XXA Unspecified injury of thorax, initial encounter: Secondary | ICD-10-CM

## 2018-06-02 DIAGNOSIS — J189 Pneumonia, unspecified organism: Secondary | ICD-10-CM

## 2018-06-02 DIAGNOSIS — R000 Tachycardia, unspecified: Secondary | ICD-10-CM

## 2018-06-02 DIAGNOSIS — D62 Acute posthemorrhagic anemia: Secondary | ICD-10-CM

## 2018-06-02 DIAGNOSIS — I82409 Acute embolism and thrombosis of unspecified deep veins of unspecified lower extremity: Secondary | ICD-10-CM

## 2018-06-02 DIAGNOSIS — A689 Relapsing fever, unspecified: Secondary | ICD-10-CM

## 2018-06-02 LAB — PHOSPHORUS: PHOSPHORUS: 3.9 mg/dL (ref 2.5–4.6)

## 2018-06-02 LAB — MAGNESIUM: Magnesium: 2.1 mg/dL (ref 1.7–2.4)

## 2018-06-02 MED ORDER — QUETIAPINE FUMARATE 100 MG PO TABS
400.0000 mg | ORAL_TABLET | Freq: Every day | ORAL | Status: DC
  Administered 2018-06-02: 400 mg
  Filled 2018-06-02 (×2): qty 4

## 2018-06-02 MED ORDER — RESOURCE THICKENUP CLEAR PO POWD
ORAL | Status: DC | PRN
  Filled 2018-06-02: qty 125

## 2018-06-02 MED ORDER — SODIUM CHLORIDE 0.9 % IV SOLN
25.0000 mg | Freq: Four times a day (QID) | INTRAVENOUS | Status: DC | PRN
  Filled 2018-06-02 (×2): qty 1

## 2018-06-02 MED ORDER — CLONAZEPAM 0.5 MG PO TBDP
0.5000 mg | ORAL_TABLET | Freq: Three times a day (TID) | ORAL | Status: DC
  Administered 2018-06-02 (×2): 0.5 mg
  Filled 2018-06-02 (×3): qty 1

## 2018-06-02 MED ORDER — ENSURE ENLIVE PO LIQD
237.0000 mL | Freq: Two times a day (BID) | ORAL | Status: DC
  Administered 2018-06-02 – 2018-06-04 (×3): 237 mL via ORAL

## 2018-06-02 MED ORDER — PRO-STAT SUGAR FREE PO LIQD
30.0000 mL | Freq: Three times a day (TID) | ORAL | Status: DC
  Administered 2018-06-02 – 2018-06-03 (×3): 30 mL
  Filled 2018-06-02 (×4): qty 30

## 2018-06-02 MED ORDER — PIVOT 1.5 CAL PO LIQD
1000.0000 mL | ORAL | Status: DC
  Administered 2018-06-02: 1000 mL
  Filled 2018-06-02 (×4): qty 1000
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
## 2018-06-02 NOTE — Progress Notes (Signed)
Inpatient Rehabilitation Admissions Coordinator  I met with patient at bedside with his father. 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 Both parents live in [place], [place]. I discussed goals and expectations of an inpt rehab admit with patient';s father. He states he will have pt's Mom to call me tomorrow to verify caregiver support either in [place] or [place] when he is discharged. Noted by financial counselor that Medicaid and disability applications have been initiated. I will follow up tomorrow.  Danne Baxter, RN, MSN Rehab Admissions Coordinator [phone] 06/02/2018 4:58 PM

## 2018-06-03 ENCOUNTER — Encounter (HOSPITAL_COMMUNITY): Payer: Self-pay

## 2018-06-03 LAB — CBC
HCT: 33.6 % — ABNORMAL LOW (ref 39.0–52.0)
Hemoglobin: 10.6 g/dL — ABNORMAL LOW (ref 13.0–17.0)
MCH: 27.2 pg (ref 26.0–34.0)
MCHC: 31.5 g/dL (ref 30.0–36.0)
MCV: 86.2 fL (ref 78.0–100.0)
Platelets: 485 10*3/uL — ABNORMAL HIGH (ref 150–400)
RBC: 3.9 MIL/uL — ABNORMAL LOW (ref 4.22–5.81)
RDW: 17.6 % — ABNORMAL HIGH (ref 11.5–15.5)
WBC: 9.6 10*3/uL (ref 4.0–10.5)

## 2018-06-03 LAB — PHOSPHORUS: Phosphorus: 4.1 mg/dL (ref 2.5–4.6)

## 2018-06-03 LAB — MAGNESIUM: Magnesium: 2.1 mg/dL (ref 1.7–2.4)

## 2018-06-03 MED ORDER — OXYCODONE HCL 5 MG/5ML PO SOLN: 5.0000 mg | ORAL | Status: DC | PRN

## 2018-06-03 MED ORDER — QUETIAPINE FUMARATE 100 MG PO TABS: 200.0000 mg | ORAL_TABLET | Freq: Every day | ORAL | Status: DC

## 2018-06-03 MED ORDER — ACETAMINOPHEN 325 MG PO TABS
650.0000 mg | ORAL_TABLET | ORAL | Status: DC | PRN
Start: 2018-06-03 — End: 2018-06-04

## 2018-06-03 MED ORDER — PIVOT 1.5 CAL PO LIQD
1000.0000 mL | ORAL | Status: DC
  Filled 2018-06-03 (×3): qty 1000

## 2018-06-03 MED ORDER — ACETAMINOPHEN 160 MG/5ML PO SOLN: 650.0000 mg | Freq: Four times a day (QID) | ORAL | Status: DC | PRN

## 2018-06-03 MED ORDER — GUAIFENESIN 100 MG/5ML PO SOLN
5.0000 mL | Freq: Four times a day (QID) | ORAL | Status: DC
  Administered 2018-06-03 – 2018-06-04 (×5): 100 mg via ORAL
  Filled 2018-06-03 (×5): qty 10

## 2018-06-03 MED ORDER — MIDAZOLAM HCL 2 MG/2ML IJ SOLN: 1.0000 mg | INTRAMUSCULAR | Status: DC | PRN

## 2018-06-03 MED ORDER — METOPROLOL TARTRATE 25 MG/10 ML ORAL SUSPENSION
25.0000 mg | Freq: Two times a day (BID) | ORAL | Status: DC
  Administered 2018-06-03 – 2018-06-04 (×3): 25 mg via ORAL
  Filled 2018-06-03 (×3): qty 10

## 2018-06-03 MED ORDER — PANTOPRAZOLE SODIUM 40 MG PO TBEC
40.0000 mg | DELAYED_RELEASE_TABLET | Freq: Every day | ORAL | Status: DC
  Administered 2018-06-03 – 2018-06-04 (×2): 40 mg via ORAL
  Filled 2018-06-03 (×2): qty 1

## 2018-06-03 MED ORDER — FOLIC ACID 1 MG PO TABS
1.0000 mg | ORAL_TABLET | Freq: Every day | ORAL | Status: DC
  Administered 2018-06-04: 1 mg via ORAL
  Filled 2018-06-03: qty 1

## 2018-06-03 MED ORDER — QUETIAPINE FUMARATE 100 MG PO TABS
200.0000 mg | ORAL_TABLET | Freq: Every day | ORAL | Status: DC
  Administered 2018-06-03: 200 mg via ORAL
  Filled 2018-06-03: qty 2

## 2018-06-03 MED ORDER — CLONAZEPAM 0.5 MG PO TBDP
0.5000 mg | ORAL_TABLET | Freq: Two times a day (BID) | ORAL | Status: DC
  Administered 2018-06-03: 0.5 mg

## 2018-06-03 MED ORDER — CLONAZEPAM 0.5 MG PO TABS
0.5000 mg | ORAL_TABLET | Freq: Two times a day (BID) | ORAL | Status: DC
  Administered 2018-06-03 – 2018-06-04 (×2): 0.5 mg via ORAL
  Filled 2018-06-03 (×2): qty 1
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
## 2018-06-03 NOTE — Progress Notes (Signed)
Inpatient Rehabilitation Admissions Coordinator  I met with pt and his Mom at bedside. Mom will be caregiver for pt in [place] at d/c. I am hopeful for admit to CIR tomorrow pending his progress with OT today.  Danne Baxter, RN, MSN Rehab Admissions Coordinator [phone] 06/03/2018 2:51 PM

## 2018-06-03 NOTE — Progress Notes (Addendum)
Trauma Service Note  Subjective: Patient doing well.  Very appropriate and alert.  Just a bit hoarse  Objective: Vital signs in last 24 hours: Temp:  [98 F (36.7 C)-99.3 F (37.4 C)] 99 F (37.2 C) (07/03 0719) Pulse Rate:  [91-114] 97 (07/03 0719) Resp:  [18-40] 18 (07/03 0719) BP: (105-140)/(69-105) 135/89 (07/03 0719) SpO2:  [96 %-99 %] 97 % (07/03 0719) Last BM Date: 05/31/18  Intake/Output from previous day: 07/02 0701 - 07/03 0700 In: 737.9 [I.V.:387.9; IV Piggyback:350] Out: 350 [Urine:350] Intake/Output this shift: No intake/output data recorded.  General: No distress.  Lungs: Clear  Abd: Soft, good bowel sounds, not tender, tolerating tube feedings, but hope to get this stopped.  Extremities: No changes.  Still getting Lovenox therapeutically for a popliteal clot.  Will repeat the vascular duplex before converting to oral anticoagulant.  Neuro: Intact.  Patient coherent  Lab Results: CBC  Recent Labs    06/01/18 0243 06/03/18 0401  WBC 8.3 9.6  HGB 9.7* 10.6*  HCT 30.6* 33.6*  PLT 353 485*   BMET Recent Labs    06/01/18 0243  NA 141  K 3.5  CL 110  CO2 24  GLUCOSE 128*  BUN 11  CREATININE 0.44*  CALCIUM 8.0*   PT/INR No results for input(s): LABPROT, INR in the last 72 hours. ABG No results for input(s): PHART, HCO3 in the last 72 hours.  Invalid input(s): PCO2, PO2  Studies/Results: No results found.  Anti-infectives: Anti-infectives (From admission, onward)   Start     Dose/Rate Route Frequency Ordered Stop   05/30/18 1730  vancomycin (VANCOCIN) 1,250 mg in sodium chloride 0.9 % 250 mL IVPB     1,250 mg 166.7 mL/hr over 90 Minutes Intravenous Every 8 hours 05/30/18 1729 06/03/18 2359   05/30/18 1100  cefTAZidime (FORTAZ) 1 g in sodium chloride 0.9 % 100 mL IVPB     1 g 200 mL/hr over 30 Minutes Intravenous Every 8 hours 05/30/18 0930 06/03/18 2359   05/28/18 0100  vancomycin (VANCOCIN) IVPB 1000 mg/200 mL premix  Status:   Discontinued     1,000 mg 200 mL/hr over 60 Minutes Intravenous Every 8 hours 05/27/18 1728 05/30/18 1729   05/27/18 1545  vancomycin (VANCOCIN) 1,500 mg in sodium chloride 0.9 % 500 mL IVPB     1,500 mg 250 mL/hr over 120 Minutes Intravenous  Once 05/27/18 1510 05/27/18 1842   05/27/18 1530  meropenem (MERREM) 1 g in sodium chloride 0.9 % 100 mL IVPB  Status:  Discontinued     1 g 200 mL/hr over 30 Minutes Intravenous Every 8 hours 05/27/18 1519 05/30/18 0930   05/18/18 1600  vancomycin (VANCOCIN) IVPB 1000 mg/200 mL premix  Status:  Discontinued     1,000 mg 200 mL/hr over 60 Minutes Intravenous Every 8 hours 05/18/18 0724 05/20/18 0855   05/18/18 0730  vancomycin (VANCOCIN) 1,500 mg in sodium chloride 0.9 % 500 mL IVPB     1,500 mg 250 mL/hr over 120 Minutes Intravenous  Once 05/18/18 0724 05/18/18 1014   05/13/18 1830  vancomycin (VANCOCIN) IVPB 1000 mg/200 mL premix  Status:  Discontinued     1,000 mg 200 mL/hr over 60 Minutes Intravenous Every 8 hours 05/13/18 0936 05/14/18 0910   05/13/18 1030  vancomycin (VANCOCIN) 1,500 mg in sodium chloride 0.9 % 500 mL IVPB     1,500 mg 250 mL/hr over 120 Minutes Intravenous  Once 05/13/18 0936 05/13/18 1318   05/13/18 0945  meropenem (MERREM) 1  g in sodium chloride 0.9 % 100 mL IVPB  Status:  Discontinued     1 g 200 mL/hr over 30 Minutes Intravenous Every 8 hours 05/13/18 0936 05/21/18 0842   05/08/18 1700  vancomycin (VANCOCIN) IVPB 1000 mg/200 mL premix  Status:  Discontinued     1,000 mg 200 mL/hr over 60 Minutes Intravenous Every 8 hours 05/08/18 0802 05/09/18 1412   05/08/18 0815  ceFEPIme (MAXIPIME) 2 g in sodium chloride 0.9 % 100 mL IVPB  Status:  Discontinued     2 g 200 mL/hr over 30 Minutes Intravenous Every 8 hours 05/08/18 0803 05/13/18 0914   05/08/18 0800  vancomycin (VANCOCIN) 1,750 mg in sodium chloride 0.9 % 500 mL IVPB     1,750 mg 250 mL/hr over 120 Minutes Intravenous  Once 05/08/18 0802 05/08/18 1200   05/05/18 0900   ceFEPIme (MAXIPIME) 2 g in sodium chloride 0.9 % 100 mL IVPB  Status:  Discontinued     2 g 200 mL/hr over 30 Minutes Intravenous Every 12 hours 05/05/18 0755 05/08/18 0803   05/02/18 0600  ceFAZolin (ANCEF) IVPB 1 g/50 mL premix  Status:  Discontinued     1 g 100 mL/hr over 30 Minutes Intravenous Every 8 hours 05/02/18 0317 05/05/18 0755   05/02/18 0055  bacitracin 50,000 Units in sodium chloride 0.9 % 500 mL irrigation  Status:  Discontinued       As needed 05/02/18 0055 05/02/18 0209   05/01/18 2330  ceFAZolin (ANCEF) IVPB 2g/100 mL premix     2 g 200 mL/hr over 30 Minutes Intravenous  Once 05/01/18 2317 05/01/18 2355      Assessment/Plan: s/p Procedure(s): CRANIECTOMY AND REPAIR OF SCALP LACERATIONS Advance diet Repeat vascular ultrasound.  On last day of Vanco and Fortaz  LOS: 32 days   Marta Lamas. Gae Bon, MD, FACS [phone] Trauma Surgeon 06/03/2018
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
## 2018-06-04 ENCOUNTER — Inpatient Hospital Stay (HOSPITAL_COMMUNITY): Payer: Self-pay

## 2018-06-04 ENCOUNTER — Encounter (HOSPITAL_COMMUNITY): Payer: Self-pay | Admitting: Emergency Medicine

## 2018-06-04 ENCOUNTER — Inpatient Hospital Stay (HOSPITAL_COMMUNITY)
Admission: RE | Admit: 2018-06-04 | Discharge: 2018-06-16 | DRG: 092 | Disposition: A | Discharge status: Home / Self Care | Payer: Self-pay | Attending: Physical Medicine & Rehabilitation | Admitting: Physical Medicine & Rehabilitation

## 2018-06-04 ENCOUNTER — Encounter (HOSPITAL_COMMUNITY): Payer: Self-pay

## 2018-06-04 DIAGNOSIS — Z86718 Personal history of other venous thrombosis and embolism: Secondary | ICD-10-CM

## 2018-06-04 DIAGNOSIS — R4189 Other symptoms and signs involving cognitive functions and awareness: Secondary | ICD-10-CM | POA: Diagnosis present

## 2018-06-04 DIAGNOSIS — W19XXXA Unspecified fall, initial encounter: Secondary | ICD-10-CM

## 2018-06-04 DIAGNOSIS — R000 Tachycardia, unspecified: Secondary | ICD-10-CM | POA: Diagnosis present

## 2018-06-04 DIAGNOSIS — I82493 Acute embolism and thrombosis of other specified deep vein of lower extremity, bilateral: Secondary | ICD-10-CM

## 2018-06-04 DIAGNOSIS — S36113D Laceration of liver, unspecified degree, subsequent encounter: Secondary | ICD-10-CM

## 2018-06-04 DIAGNOSIS — I82409 Acute embolism and thrombosis of unspecified deep veins of unspecified lower extremity: Secondary | ICD-10-CM

## 2018-06-04 DIAGNOSIS — W1830XA Fall on same level, unspecified, initial encounter: Secondary | ICD-10-CM | POA: Diagnosis not present

## 2018-06-04 DIAGNOSIS — R1310 Dysphagia, unspecified: Secondary | ICD-10-CM

## 2018-06-04 DIAGNOSIS — I10 Essential (primary) hypertension: Secondary | ICD-10-CM

## 2018-06-04 DIAGNOSIS — S0990XD Unspecified injury of head, subsequent encounter: Secondary | ICD-10-CM

## 2018-06-04 DIAGNOSIS — R7881 Bacteremia: Secondary | ICD-10-CM

## 2018-06-04 DIAGNOSIS — S36113A Laceration of liver, unspecified degree, initial encounter: Secondary | ICD-10-CM

## 2018-06-04 DIAGNOSIS — N390 Urinary tract infection, site not specified: Secondary | ICD-10-CM

## 2018-06-04 DIAGNOSIS — Z87820 Personal history of traumatic brain injury: Secondary | ICD-10-CM

## 2018-06-04 DIAGNOSIS — S0291XA Unspecified fracture of skull, initial encounter for closed fracture: Secondary | ICD-10-CM | POA: Diagnosis present

## 2018-06-04 DIAGNOSIS — B965 Pseudomonas (aeruginosa) (mallei) (pseudomallei) as the cause of diseases classified elsewhere: Secondary | ICD-10-CM | POA: Diagnosis present

## 2018-06-04 DIAGNOSIS — R2689 Other abnormalities of gait and mobility: Principal | ICD-10-CM | POA: Diagnosis present

## 2018-06-04 DIAGNOSIS — S069X9A Unspecified intracranial injury with loss of consciousness of unspecified duration, initial encounter: Secondary | ICD-10-CM

## 2018-06-04 DIAGNOSIS — G8918 Other acute postprocedural pain: Secondary | ICD-10-CM

## 2018-06-04 DIAGNOSIS — R410 Disorientation, unspecified: Secondary | ICD-10-CM

## 2018-06-04 DIAGNOSIS — S27322D Contusion of lung, bilateral, subsequent encounter: Secondary | ICD-10-CM

## 2018-06-04 DIAGNOSIS — T07XXXA Unspecified multiple injuries, initial encounter: Secondary | ICD-10-CM

## 2018-06-04 DIAGNOSIS — E876 Hypokalemia: Secondary | ICD-10-CM

## 2018-06-04 DIAGNOSIS — F17210 Nicotine dependence, cigarettes, uncomplicated: Secondary | ICD-10-CM | POA: Diagnosis present

## 2018-06-04 DIAGNOSIS — D62 Acute posthemorrhagic anemia: Secondary | ICD-10-CM | POA: Diagnosis present

## 2018-06-04 DIAGNOSIS — S2243XD Multiple fractures of ribs, bilateral, subsequent encounter for fracture with routine healing: Secondary | ICD-10-CM

## 2018-06-04 DIAGNOSIS — S36116D Major laceration of liver, subsequent encounter: Secondary | ICD-10-CM

## 2018-06-04 DIAGNOSIS — S0291XD Unspecified fracture of skull, subsequent encounter for fracture with routine healing: Secondary | ICD-10-CM

## 2018-06-04 DIAGNOSIS — S069X9D Unspecified intracranial injury with loss of consciousness of unspecified duration, subsequent encounter: Secondary | ICD-10-CM

## 2018-06-04 LAB — MAGNESIUM: Magnesium: 2.1 mg/dL (ref 1.7–2.4)

## 2018-06-04 LAB — PHOSPHORUS: Phosphorus: 4.4 mg/dL (ref 2.5–4.6)

## 2018-06-04 MED ORDER — PANTOPRAZOLE SODIUM 40 MG PO TBEC
40.0000 mg | DELAYED_RELEASE_TABLET | Freq: Every day | ORAL | Status: DC
  Administered 2018-06-05 – 2018-06-16 (×12): 40 mg via ORAL
  Filled 2018-06-04 (×12): qty 1

## 2018-06-04 MED ORDER — CLONAZEPAM 0.5 MG PO TABS
0.5000 mg | ORAL_TABLET | Freq: Two times a day (BID) | ORAL | Status: DC
  Administered 2018-06-04 – 2018-06-09 (×11): 0.5 mg via ORAL
  Filled 2018-06-04 (×12): qty 1

## 2018-06-04 MED ORDER — ALUM & MAG HYDROXIDE-SIMETH 200-200-20 MG/5ML PO SUSP: 30.0000 mL | ORAL | Status: DC | PRN

## 2018-06-04 MED ORDER — PROCHLORPERAZINE MALEATE 5 MG PO TABS: 5.0000 mg | ORAL_TABLET | Freq: Four times a day (QID) | ORAL | Status: DC | PRN

## 2018-06-04 MED ORDER — PROCHLORPERAZINE EDISYLATE 10 MG/2ML IJ SOLN: 5.0000 mg | Freq: Four times a day (QID) | INTRAMUSCULAR | Status: DC | PRN

## 2018-06-04 MED ORDER — RESOURCE THICKENUP CLEAR PO POWD
ORAL | Status: DC | PRN
  Filled 2018-06-04: qty 125

## 2018-06-04 MED ORDER — PROCHLORPERAZINE 25 MG RE SUPP: 12.5000 mg | Freq: Four times a day (QID) | RECTAL | Status: DC | PRN

## 2018-06-04 MED ORDER — ENOXAPARIN SODIUM 80 MG/0.8ML ~~LOC~~ SOLN
1.0000 mg/kg | Freq: Two times a day (BID) | SUBCUTANEOUS | Status: DC
  Administered 2018-06-04 – 2018-06-07 (×6): 75 mg via SUBCUTANEOUS
  Administered 2018-06-07: 80 mg via SUBCUTANEOUS
  Administered 2018-06-08 – 2018-06-10 (×6): 75 mg via SUBCUTANEOUS
  Filled 2018-06-04 (×8): qty 0.75
  Filled 2018-06-04: qty 0.8
  Filled 2018-06-04: qty 0.75
  Filled 2018-06-04: qty 0.8
  Filled 2018-06-04 (×3): qty 0.75

## 2018-06-04 MED ORDER — BISACODYL 10 MG RE SUPP: 10.0000 mg | Freq: Every day | RECTAL | Status: DC | PRN

## 2018-06-04 MED ORDER — ALBUTEROL SULFATE (2.5 MG/3ML) 0.083% IN NEBU: 2.5000 mg | INHALATION_SOLUTION | RESPIRATORY_TRACT | Status: DC | PRN

## 2018-06-04 MED ORDER — TRAZODONE HCL 50 MG PO TABS: 25.0000 mg | ORAL_TABLET | Freq: Every evening | ORAL | Status: DC | PRN

## 2018-06-04 MED ORDER — METOPROLOL TARTRATE 25 MG/10 ML ORAL SUSPENSION
25.0000 mg | Freq: Two times a day (BID) | ORAL | Status: DC
  Administered 2018-06-04 – 2018-06-05 (×2): 25 mg via ORAL
  Filled 2018-06-04 (×2): qty 10

## 2018-06-04 MED ORDER — FLEET ENEMA 7-19 GM/118ML RE ENEM
1.0000 | ENEMA | Freq: Once | RECTAL | Status: DC | PRN
Start: 2018-06-04 — End: 2018-06-16

## 2018-06-04 MED ORDER — OXYCODONE HCL 5 MG PO TABS: 5.0000 mg | ORAL_TABLET | ORAL | Status: DC | PRN

## 2018-06-04 MED ORDER — DIPHENHYDRAMINE HCL 12.5 MG/5ML PO ELIX: 12.5000 mg | ORAL_SOLUTION | Freq: Four times a day (QID) | ORAL | Status: DC | PRN

## 2018-06-04 MED ORDER — FOLIC ACID 1 MG PO TABS
1.0000 mg | ORAL_TABLET | Freq: Every day | ORAL | Status: DC
  Administered 2018-06-05 – 2018-06-12 (×8): 1 mg via ORAL
  Filled 2018-06-04 (×8): qty 1

## 2018-06-04 MED ORDER — VITAMIN B-1 100 MG PO TABS
100.0000 mg | ORAL_TABLET | Freq: Every day | ORAL | Status: DC
  Administered 2018-06-05 – 2018-06-08 (×4): 100 mg
  Filled 2018-06-04 (×4): qty 1

## 2018-06-04 MED ORDER — QUETIAPINE FUMARATE 100 MG PO TABS
200.0000 mg | ORAL_TABLET | Freq: Every day | ORAL | Status: DC
  Administered 2018-06-04 – 2018-06-07 (×4): 200 mg via ORAL
  Filled 2018-06-04 (×4): qty 2

## 2018-06-04 MED ORDER — ACETAMINOPHEN 325 MG PO TABS
325.0000 mg | ORAL_TABLET | ORAL | Status: DC | PRN
  Administered 2018-06-05: 650 mg via ORAL
  Filled 2018-06-04: qty 2

## 2018-06-04 MED ORDER — POLYETHYLENE GLYCOL 3350 17 G PO PACK: 17.0000 g | PACK | Freq: Every day | ORAL | Status: DC | PRN

## 2018-06-04 MED ORDER — GUAIFENESIN 100 MG/5ML PO SOLN
5.0000 mL | Freq: Four times a day (QID) | ORAL | Status: DC
  Administered 2018-06-04 – 2018-06-12 (×29): 100 mg via ORAL
  Filled 2018-06-04 (×30): qty 5

## 2018-06-04 NOTE — Progress Notes (Signed)
Report given to RN on 4W. Patient d/c to CIR with IV intact, vitals stable; family and belongings at bedside.
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
## 2018-06-04 NOTE — Progress Notes (Signed)
*  Preliminary Results* Bilateral lower extremity venous duplex completed. Bilateral lower extremities are negative for deep vein thrombosis. Previously seen bilateral gastrocnemius DVT from 05/14/2018 appears to have resolved. There is no evidence of Baker's cyst bilaterally.  06/04/2018 11:30 AM Gertie FeyMichelle Emmarae Cowdery, BS, RVT, RDCS, RDMS

## 2018-06-04 NOTE — Progress Notes (Signed)
Pt arrived to unit via wheelchair with family and RN from 4N. Pt transferred to bed via stedy without difficulty. Pt oriented to unit as well as fall prevention program.Pt and family verbalizes understanding. Pt awake and alert. Call bed within reach, bed alarm on and in lowest position. Ronnette JuniperKatlynn E Roosevelt Bisher, LPN

## 2018-06-04 NOTE — Progress Notes (Signed)
Trauma Service Note  Subjective: Patient looks superb.  No distress.  Objective: Vital signs in last 24 hours: Temp:  [98.3 F (36.8 C)-100.6 F (38.1 C)] 98.8 F (37.1 C) (07/04 0732) Pulse Rate:  [88-109] 98 (07/04 0732) Resp:  [16-26] 21 (07/04 0732) BP: (113-146)/(84-96) 146/96 (07/04 0732) SpO2:  [97 %-99 %] 98 % (07/04 0732) Weight:  [70.9 kg (156 lb 4.9 oz)] 70.9 kg (156 lb 4.9 oz) (07/04 0500) Last BM Date: 06/03/18  Intake/Output from previous day: 07/03 0701 - 07/04 0700 In: 4994.8 [P.O.:480; I.V.:943.4; IV Piggyback:3571.4] Out: 1850 [Urine:1850] Intake/Output this shift: No intake/output data recorded.  General: No distress and anxious to go to Rehab soon  Lungs: Clear to auscultation  Abd: Soft, benign  Extremities: No changes  Neuro: Intact and improving.  Lab Results: CBC  Recent Labs    06/03/18 0401  WBC 9.6  HGB 10.6*  HCT 33.6*  PLT 485*   BMET No results for input(s): NA, K, CL, CO2, GLUCOSE, BUN, CREATININE, CALCIUM in the last 72 hours. PT/INR No results for input(s): LABPROT, INR in the last 72 hours. ABG No results for input(s): PHART, HCO3 in the last 72 hours.  Invalid input(s): PCO2, PO2  Studies/Results: No results found.  Anti-infectives: Anti-infectives (From admission, onward)   Start     Dose/Rate Route Frequency Ordered Stop   05/30/18 1730  vancomycin (VANCOCIN) 1,250 mg in sodium chloride 0.9 % 250 mL IVPB     1,250 mg 166.7 mL/hr over 90 Minutes Intravenous Every 8 hours 05/30/18 1729 06/03/18 1924   05/30/18 1100  cefTAZidime (FORTAZ) 1 g in sodium chloride 0.9 % 100 mL IVPB     1 g 200 mL/hr over 30 Minutes Intravenous Every 8 hours 05/30/18 0930 06/03/18 2010   05/28/18 0100  vancomycin (VANCOCIN) IVPB 1000 mg/200 mL premix  Status:  Discontinued     1,000 mg 200 mL/hr over 60 Minutes Intravenous Every 8 hours 05/27/18 1728 05/30/18 1729   05/27/18 1545  vancomycin (VANCOCIN) 1,500 mg in sodium chloride 0.9  % 500 mL IVPB     1,500 mg 250 mL/hr over 120 Minutes Intravenous  Once 05/27/18 1510 05/27/18 1842   05/27/18 1530  meropenem (MERREM) 1 g in sodium chloride 0.9 % 100 mL IVPB  Status:  Discontinued     1 g 200 mL/hr over 30 Minutes Intravenous Every 8 hours 05/27/18 1519 05/30/18 0930   05/18/18 1600  vancomycin (VANCOCIN) IVPB 1000 mg/200 mL premix  Status:  Discontinued     1,000 mg 200 mL/hr over 60 Minutes Intravenous Every 8 hours 05/18/18 0724 05/20/18 0855   05/18/18 0730  vancomycin (VANCOCIN) 1,500 mg in sodium chloride 0.9 % 500 mL IVPB     1,500 mg 250 mL/hr over 120 Minutes Intravenous  Once 05/18/18 0724 05/18/18 1014   05/13/18 1830  vancomycin (VANCOCIN) IVPB 1000 mg/200 mL premix  Status:  Discontinued     1,000 mg 200 mL/hr over 60 Minutes Intravenous Every 8 hours 05/13/18 0936 05/14/18 0910   05/13/18 1030  vancomycin (VANCOCIN) 1,500 mg in sodium chloride 0.9 % 500 mL IVPB     1,500 mg 250 mL/hr over 120 Minutes Intravenous  Once 05/13/18 0936 05/13/18 1318   05/13/18 0945  meropenem (MERREM) 1 g in sodium chloride 0.9 % 100 mL IVPB  Status:  Discontinued     1 g 200 mL/hr over 30 Minutes Intravenous Every 8 hours 05/13/18 0936 05/21/18 0842   05/08/18 1700  vancomycin (VANCOCIN) IVPB 1000 mg/200 mL premix  Status:  Discontinued     1,000 mg 200 mL/hr over 60 Minutes Intravenous Every 8 hours 05/08/18 0802 05/09/18 1412   05/08/18 0815  ceFEPIme (MAXIPIME) 2 g in sodium chloride 0.9 % 100 mL IVPB  Status:  Discontinued     2 g 200 mL/hr over 30 Minutes Intravenous Every 8 hours 05/08/18 0803 05/13/18 0914   05/08/18 0800  vancomycin (VANCOCIN) 1,750 mg in sodium chloride 0.9 % 500 mL IVPB     1,750 mg 250 mL/hr over 120 Minutes Intravenous  Once 05/08/18 0802 05/08/18 1200   05/05/18 0900  ceFEPIme (MAXIPIME) 2 g in sodium chloride 0.9 % 100 mL IVPB  Status:  Discontinued     2 g 200 mL/hr over 30 Minutes Intravenous Every 12 hours 05/05/18 0755 05/08/18 0803    05/02/18 0600  ceFAZolin (ANCEF) IVPB 1 g/50 mL premix  Status:  Discontinued     1 g 100 mL/hr over 30 Minutes Intravenous Every 8 hours 05/02/18 0317 05/05/18 0755   05/02/18 0055  bacitracin 50,000 Units in sodium chloride 0.9 % 500 mL irrigation  Status:  Discontinued       As needed 05/02/18 0055 05/02/18 0209   05/01/18 2330  ceFAZolin (ANCEF) IVPB 2g/100 mL premix     2 g 200 mL/hr over 30 Minutes Intravenous  Once 05/01/18 2317 05/01/18 2355      Assessment/Plan: s/p Procedure(s): CRANIECTOMY AND REPAIR OF SCALP LACERATIONS Probably transfer to Rehab today.  LOS: 33 days   Marta Lamas. Gae Bon, MD, FACS [phone] Trauma Surgeon 06/04/2018

## 2018-06-04 NOTE — Progress Notes (Signed)
   06/04/18 1450  What Happened  Was fall witnessed? No  Was patient injured? No  Patient found on floor  Found by Staff-comment  Stated prior activity bathroom-unassisted  Follow Up  MD notified Pam PA  Time MD notified 1450  Additional tests Yes-comment  Simple treatment Other (comment) (pt assessed- no physical injuries noted)   RN responded to room for bed alarm sounding.  Patient was noted to be laying on his belly on floor. Assisted patient back to bed.  He stated he was trying to go to the bathroom.  Patient normally has family in the room at all times.  No family present at time of fall.  Patient stated he had just woken up from dozing off and thought he would go to the bathroom.  Patient denies hitting his head.  Denies pain.  States he "just slipped."  [employer] SolutionsCalled Pam Love, PA.  Since fall unwitnessed and patient is missing part of skull, stat CT of head ordered along with telemonitoring from this point forward.  Family updated on status once returned to room.  Dani Gobbleeardon, Chrys Landgrebe J, RN
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
## 2018-06-04 NOTE — Progress Notes (Signed)
Subjective: Patient seen yesterday and again today.  Resting in bed comfortably each day.  Continuing PT, OT, and ST, and awaiting CIR.  Objective: Vital signs in last 24 hours: Vitals:   06/03/18 2132 06/03/18 2350 06/04/18 0500 06/04/18 0732  BP:  113/84  (!) 146/96  Pulse: (!) 105 (!) 104  98  Resp:  16  (!) 21  Temp:  99.1 F (37.3 C)  98.8 F (37.1 C)  TempSrc:  Oral  Oral  SpO2:  98%  98%  Weight:   70.9 kg (156 lb 4.9 oz)   Height:        Intake/Output from previous day: 07/03 0701 - 07/04 0700 In: 4994.8 [P.O.:480; I.V.:943.4; IV Piggyback:3571.4] Out: 1850 [Urine:1850] Intake/Output this shift: No intake/output data recorded.  Physical Exam: Awake, alert, following commands.  Moving all 4 extremities.  Laceration healed well; no erythema or drainage, moderate subgaleal effusion.  CBC Recent Labs    06/03/18 0401  WBC 9.6  HGB 10.6*  HCT 33.6*  PLT 485*    Assessment/Plan: Stabilized from an overall trauma perspective.  Continuing therapies, awaiting CIR.  Spoke with the patient and his mother about his condition and plans for treatment and care.   Hewitt ShortsNUDELMAN,ROBERT W, MD 06/04/2018, 9:55 AM
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
## 2018-06-05 ENCOUNTER — Inpatient Hospital Stay (HOSPITAL_COMMUNITY): Payer: Self-pay | Admitting: Speech Pathology

## 2018-06-05 ENCOUNTER — Inpatient Hospital Stay (HOSPITAL_COMMUNITY): Payer: Self-pay | Admitting: Physical Therapy

## 2018-06-05 ENCOUNTER — Inpatient Hospital Stay (HOSPITAL_COMMUNITY): Payer: Self-pay

## 2018-06-05 ENCOUNTER — Inpatient Hospital Stay (HOSPITAL_COMMUNITY): Payer: Self-pay | Admitting: Occupational Therapy

## 2018-06-05 DIAGNOSIS — W19XXXA Unspecified fall, initial encounter: Secondary | ICD-10-CM

## 2018-06-05 DIAGNOSIS — S0291XS Unspecified fracture of skull, sequela: Secondary | ICD-10-CM

## 2018-06-05 DIAGNOSIS — R1310 Dysphagia, unspecified: Secondary | ICD-10-CM

## 2018-06-05 DIAGNOSIS — S069X9S Unspecified intracranial injury with loss of consciousness of unspecified duration, sequela: Secondary | ICD-10-CM

## 2018-06-05 DIAGNOSIS — E876 Hypokalemia: Secondary | ICD-10-CM

## 2018-06-05 DIAGNOSIS — S36113S Laceration of liver, unspecified degree, sequela: Secondary | ICD-10-CM

## 2018-06-05 LAB — CBC WITH DIFFERENTIAL/PLATELET
ABS IMMATURE GRANULOCYTES: 0.1 10*3/uL (ref 0.0–0.1)
BASOS ABS: 0 10*3/uL (ref 0.0–0.1)
BASOS PCT: 0 %
Eosinophils Absolute: 0.2 10*3/uL (ref 0.0–0.7)
Eosinophils Relative: 2 %
HCT: 34.3 % — ABNORMAL LOW (ref 39.0–52.0)
HEMOGLOBIN: 10.7 g/dL — AB (ref 13.0–17.0)
IMMATURE GRANULOCYTES: 1 %
LYMPHS PCT: 28 %
Lymphs Abs: 2.7 10*3/uL (ref 0.7–4.0)
MCH: 27.1 pg (ref 26.0–34.0)
MCHC: 31.2 g/dL (ref 30.0–36.0)
MCV: 86.8 fL (ref 78.0–100.0)
Monocytes Absolute: 0.8 10*3/uL (ref 0.1–1.0)
Monocytes Relative: 8 %
NEUTROS ABS: 5.9 10*3/uL (ref 1.7–7.7)
Neutrophils Relative %: 61 %
PLATELETS: 453 10*3/uL — AB (ref 150–400)
RBC: 3.95 MIL/uL — ABNORMAL LOW (ref 4.22–5.81)
RDW: 18.1 % — ABNORMAL HIGH (ref 11.5–15.5)
WBC: 9.7 10*3/uL (ref 4.0–10.5)

## 2018-06-05 LAB — COMPREHENSIVE METABOLIC PANEL
ALK PHOS: 127 U/L — AB (ref 38–126)
ALT: 86 U/L — AB (ref 0–44)
ANION GAP: 8 (ref 5–15)
AST: 32 U/L (ref 15–41)
Albumin: 2.5 g/dL — ABNORMAL LOW (ref 3.5–5.0)
BUN: 5 mg/dL — ABNORMAL LOW (ref 6–20)
CALCIUM: 8.7 mg/dL — AB (ref 8.9–10.3)
CO2: 25 mmol/L (ref 22–32)
CREATININE: 0.57 mg/dL — AB (ref 0.61–1.24)
Chloride: 105 mmol/L (ref 98–111)
Glucose, Bld: 96 mg/dL (ref 70–99)
Potassium: 3.3 mmol/L — ABNORMAL LOW (ref 3.5–5.1)
Sodium: 138 mmol/L (ref 135–145)
Total Bilirubin: 0.7 mg/dL (ref 0.3–1.2)
Total Protein: 6.8 g/dL (ref 6.5–8.1)

## 2018-06-05 MED ORDER — POTASSIUM CHLORIDE CRYS ER 20 MEQ PO TBCR
30.0000 meq | EXTENDED_RELEASE_TABLET | Freq: Two times a day (BID) | ORAL | Status: AC
  Administered 2018-06-05 – 2018-06-06 (×4): 30 meq via ORAL
  Filled 2018-06-05 (×4): qty 1

## 2018-06-05 MED ORDER — METOPROLOL TARTRATE 25 MG PO TABS
25.0000 mg | ORAL_TABLET | Freq: Two times a day (BID) | ORAL | Status: DC
Start: 2018-06-05 — End: 2018-06-16
  Administered 2018-06-05 – 2018-06-16 (×22): 25 mg via ORAL
  Filled 2018-06-05 (×22): qty 1
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
## 2018-06-05 NOTE — Progress Notes (Signed)
Occupational Therapy Session Note  Patient Details  Name: Frederick Molina. MRN: [id number] Date of Birth: Sep 24, 1986  Today's Date: 06/05/2018 OT Individual Time: 1530-1600 OT Individual Time Calculation (min): 30 min    Short Term Goals: Week 1:  OT Short Term Goal 1 (Week 1): Pt will be able to transfer to toilet with S with stand pivot. OT Short Term Goal 2 (Week 1): Pt will be able to complete 3/3 toileting tasks with S.  OT Short Term Goal 3 (Week 1): Pt will be able to complete room exercise program for his shoulders with no cues. OT Short Term Goal 4 (Week 1): Pt will demonstrate improved Kerrick to cut food with fork and knife.  Skilled Therapeutic Interventions/Progress Updates:  1:1. Pt and father present. Pt agreeable to reviewing shower DME option. Pt completes stand pivot transfers with RW EOB>w/c>recliner with VC for hand placement and RW management. OT educates pt and father on shower chair v TTB options. Pt able to complete TTB transfer with RW with min guard and VC for sequencing. Pt also able to step over edge of tub with use of grab bars and min A for steadying. Pt ambulates back to room with RW and min guard. Exited session with pt seated in recliner with call light in reach and all needs met. Therapy Documentation Precautions:  Precautions Precautions: Fall Precaution Comments: skull flap out on R Restrictions Weight Bearing Restrictions: No General:   Vital Signs: Therapy Vitals Temp: 98.7 F (37.1 C) Temp Source: Oral Pulse Rate: 94 Resp: 18 BP: 132/77 Patient Position (if appropriate): Lying Oxygen Therapy SpO2: 99 % O2 Device: Room Air Pain: Pain Assessment Pain Score: 0-No pain  See Function Navigator for Current Functional Status.   Therapy/Group: Individual Therapy  Tonny Branch 06/05/2018, 4:14 PM
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
## 2018-06-05 NOTE — Progress Notes (Signed)
[facility]     PROGRESS NOTE  Subjective/Complaints:  Patient seen lying in bed this morning. He states he slept well overnight. He notes he had a fall yesterday. He states he tried to go to the bathroom, but now realizes that he is not able to do so independently. He denies any sequela from the fall. CT was ordered at that time. Patient states he injured to begin therapies.  ROS: Denies CP, SOB, nausea, vomiting, diarrhea.  Objective: Vital Signs: Blood pressure 126/78, pulse 92, temperature 98.1 F (36.7 C), temperature source Oral, resp. rate 18, height 5\' 6"  (1.676 m), weight 71.3 kg (157 lb 3 oz), SpO2 98 %. Ct Head Wo Contrast  Result Date: 06/04/2018 CLINICAL DATA:  Pt recently had unwitnessed fall upstairs while attempting to go to restroomRecent craniectomy for depressed skull fx EXAM: CT HEAD WITHOUT CONTRAST TECHNIQUE: Contiguous axial images were obtained from the base of the skull through the vertex without intravenous contrast. COMPARISON:  None. FINDINGS: Brain: No evidence of acute infarction, hemorrhage, hydrocephalus, extra-axial collection or mass lesion/mass effect. Vascular: No hyperdense vessel or unexpected calcification. Skull: Right frontoparietal craniectomy defect. No acute skull fracture or bone lesion. Sinuses/Orbits: Moderate mucosal thickening with dependent fluid in the right maxillary sinus. Mild mucosal thickening with dependent fluid in the right sphenoid sinus with a small amount of dependent fluid and mild mucosal thickening in the left sphenoid sinus. Fluid attenuation in several of the posterior inferior right mastoid air cells. Globes and orbits are unremarkable. Other: Subgaleal fluid collection overlies the right craniectomy site, with average Hounsfield units under 10. IMPRESSION: 1. No acute intracranial abnormalities. 2. Right frontoparietal craniectomy defect with an overlying subgaleal fluid collection. 3. Right-sided  maxillary and bilateral sphenoid sinus disease. Consider acute sinusitis in the proper clinical setting. Electronically Signed   By: Amie Portlandavid  Ormond M.D.   On: 06/04/2018 16:25   Recent Labs    06/03/18 0401 06/05/18 0640  WBC 9.6 9.7  HGB 10.6* 10.7*  HCT 33.6* 34.3*  PLT 485* 453*   Recent Labs    06/05/18 0640  NA 138  K 3.3*  CL 105  GLUCOSE 96  BUN 5*  CREATININE 0.57*  CALCIUM 8.7*   CBG (last 3)  No results for input(s): GLUCAP in the last 72 hours.  Wt Readings from Last 3 Encounters:  06/04/18 71.3 kg (157 lb 3 oz)  06/04/18 70.9 kg (156 lb 4.9 oz)  01/08/17 66.4 kg (146 lb 5 oz)    Physical Exam:  BP 126/78 (BP Location: Right Arm)   Pulse 92   Temp 98.1 F (36.7 C) (Oral)   Resp 18   Ht 5\' 6"  (1.676 m)   Wt 71.3 kg (157 lb 3 oz)   SpO2 98%   BMI 25.37 kg/m  Constitutional: He appears well-developed and well-nourished.  HENT: edema.   Eyes: EOM are normal. No discharge.  Cardiovascular: Normal rate and regular rhythm. No JVD. Respiratory: Effort normal and breath sounds normal.  GI: Soft. Bowel sounds are normal.  Musculoskeletal: No edema or tenderness in extremities  Neurological: He is alert.  Alert and Oriented x3 Dysphonia improving Able to follow basic commands  Motor: RUE/RLE: 5/5 proximal to distal LUE: 4-4 +/5 proximal to distal LLE: 4+-5/5 proximal to distal  Skin: dressings on back C/D/I See above  Psychiatric: His affect is blunt. His speech is delayed. He is slowed, improving.   Assessment/Plan: 1. Functional deficits secondary to polytrauma with TBI  which require 3+ hours per day of interdisciplinary therapy in a comprehensive inpatient rehab setting. Physiatrist is providing close team supervision and 24 hour management of active medical problems listed below. Physiatrist and rehab team continue to assess barriers to discharge/monitor patient progress toward functional and medical goals.  Function:  Bathing Bathing position       Bathing parts      Bathing assist        Upper Body Dressing/Undressing Upper body dressing                    Upper body assist        Lower Body Dressing/Undressing Lower body dressing                                  Lower body assist        Toileting Toileting     Toileting steps completed by helper: Performs perineal hygiene(Patient did not want staff to assist with peri care. Family/friend helped him while staff observed.)    Toileting assist Assist level: Two helpers   Transfers Chair/bed Optician, dispensing          Cognition Comprehension Comprehension assist level: Follows basic conversation/direction with extra time/assistive device  Expression Expression assist level: Expresses basic 25 - 49% of the time/requires cueing 50 - 75% of the time. Uses single words/gestures.  Social Interaction Social Interaction assist level: Interacts appropriately 50 - 74% of the time - May be physically or verbally inappropriate.  Problem Solving    Memory      Medical Problem List and Plan: 1.  Deficits with mobility, endurance, strength, cognition, self-care secondary to polytrauma with TBI.  Begin CIR  Patient with fall on 7/4, repeat CT reviewed, stable, neurogenic stable, continue to monitor 2. Acute bilateral gastrocnemius DVT (05/14/18)/Anticoagulation: Pharmaceutical: Lovenox--continue treatment dose.    Repeat vascular Dopplers negative for DVT  Will discuss change in anticoagulation 3. Pain Management: Wean oxycodone as tolerated.  4. Mood: LCSW to follow for evaluation and support.  5. Neuropsych: This patient is not fully capable of making decisions on his own behalf. 6. Skin/Wound Care: Continue foam dressings to back where skin has sloughed off. Routine pressure relief measures.  7. Fluids/Electrolytes/Nutrition: Monitor I/O as on dysphagia 1, nectars.    Advance diet as  tolerated 8. Pseudomonas UTI/Staph bacteremia with leucocytosis: Has completed 7 day course of Fortaz and Vancomycin on 7/3.  9. Delirium:  Precedex weaned off and agitation improving with Seroquel at bedtime as well as Klonopin bid  Will wean as tolerated.  10. Acute respiratory failure: Resolved.  11. Bilateral rib fractures/large R-PTX/Pulmonary contusion: Encourage IS when awake.  12. ABLA: Stable overall. Continue to monitor with serial checks.   Hemoglobin 10.7 on 7/5  Continue to monitor 13. Liver laceration: Abnormal LFTs due to liver trauma. Continue to monitor.   Elevated, but improving on 7/5 14. HTN with tachycardia:   Likely reactive, monitor with increased mobility 15. Hypokalemia  Potassium 3.3 on 7/5  Supplemented 2 days  Labs ordered for Monday  LOS (Days) 1 A FACE TO FACE EVALUATION WAS PERFORMED  Tabria Steines Karis Juba 06/05/2018 9:43 AM
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
## 2018-06-06 ENCOUNTER — Inpatient Hospital Stay (HOSPITAL_COMMUNITY): Payer: 59 | Admitting: Speech Pathology

## 2018-06-06 ENCOUNTER — Inpatient Hospital Stay (HOSPITAL_COMMUNITY): Payer: 59 | Admitting: Occupational Therapy

## 2018-06-06 ENCOUNTER — Inpatient Hospital Stay (HOSPITAL_COMMUNITY): Payer: 59 | Admitting: Physical Therapy

## 2018-06-06 ENCOUNTER — Inpatient Hospital Stay (HOSPITAL_COMMUNITY): Payer: 59

## 2018-06-06 NOTE — Progress Notes (Signed)
Occupational Therapy Session Note  Patient Details  Name: Frederick ForesterSteven Guidice Jr. MRN: [id number] Date of Birth: 1986/11/02  Today's Date: 06/06/2018 OT Individual Time: 1400-1430 OT Individual Time Calculation (min): 30 min    Short Term Goals: Week 1:  OT Short Term Goal 1 (Week 1): Pt will be able to transfer to toilet with S with stand pivot. OT Short Term Goal 2 (Week 1): Pt will be able to complete 3/3 toileting tasks with S.  OT Short Term Goal 3 (Week 1): Pt will be able to complete room exercise program for his shoulders with no cues. OT Short Term Goal 4 (Week 1): Pt will demonstrate improved FMC to cut food with fork and knife.  Skilled Therapeutic Interventions/Progress Updates:    Pt received sitting up in w/c agreeable to therapy with no c/o pain and fiance/mother present. Session focused on dynamic standing and sitting balance to reduce fall risk. Pt completed functional mobility to therapy gym with RW, requiring CGA and vc for safety awareness when transitioning to sit. Pt completed standing level B ball throwing task with no UE support, one set with foam board and one without. Pt experienced 2-3 LOB with min A for correction. Pt then completed 3x 20 functional reaching exercises, reaching forward, overhead, and across midline while sitting on a physioball to challenge core stabilization. Task was graded throughout to increase challenge to B UE and core. Pt used Rw to complete functional mobility back to room and was left sitting up in chair with family present and telesitter on.   Therapy Documentation Precautions:  Precautions Precautions: Fall Precaution Comments: skull flap out on R Restrictions Weight Bearing Restrictions: No Pain: Pain Assessment Pain Scale: 0-10 Pain Score: 0-No pain ADL: ADL ADL Comments: refer to functional navigator  See Function Navigator for Current Functional Status.   Therapy/Group: Individual Therapy  Crissie ReeseSandra H Melina Mosteller 06/06/2018, 3:21  PM
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
## 2018-06-06 NOTE — Progress Notes (Signed)
Speech Language Pathology Daily Session Note  Patient Details  Name: Frederick ForesterSteven Butzer Jr. MRN: [id number] Date of Birth: [birth year]  Today's Date: 06/06/2018 SLP Individual Time: 1130-1200 SLP Individual Time Calculation (min): 30 min  Short Term Goals: Week 1: SLP Short Term Goal 1 (Week 1): Patient will consume trials of Dys. 2 textures with efficient mastication and complete oral clearing without overt s/s of aspiratoin with supervision verbal cues prior to upgrade.  SLP Short Term Goal 2 (Week 1): Patient will consume trials of thin liquids via cup without overt s/s of aspiration and supervision verbal cues for use of small sips to assess readiness repeat objective swallow study.  SLP Short Term Goal 3 (Week 1): Patient will demonstrate complex problem solving for functional tasks with supervision verbal cues.  SLP Short Term Goal 4 (Week 1): Patient will recall new, daily information with supervision verbal cues for use of strategies.  SLP Short Term Goal 5 (Week 1): Patient will self-monitor and correct errors during functional tasks with supervision verbal cues.  SLP Short Term Goal 6 (Week 1): Patient will utilize an increased vocal intensity at the conversation level to maximize intelligibility to 100% in a moderately distracting enviornment with supervision verbal cues.    Skilled Therapeutic Interventions: Skilled treatment session focused on dysphagia goals. SLP facilitated session by providing skilled observation of pt consuming dysphagia 2 lunch tray with nectar thick liquids. Pt appropriately set-up his tray and feed himself without any help. Pt with appropriate bite/bolus size, rate and as a result he demonstrated complete oral clearing and no over s/s of aspiration. Education provided on upgrading diet to dysphagia 2, continuing nectar thick liquids until MBS on 7/8, procedure of MBS and all questions answered to pt and family satisfaction. Education provided to nursing on diet  upgrade.      Function:  Eating Eating   Modified Consistency Diet: Yes Eating Assist Level: Swallowing techniques: self managed   Eating Set Up Assist For: Opening containers       Cognition Comprehension Comprehension assist level: Follows basic conversation/direction with no assist  Expression   Expression assist level: Expresses basic needs/ideas: With no assist  Social Interaction Social Interaction assist level: Interacts appropriately with others with medication or extra time (anti-anxiety, antidepressant).  Problem Solving Problem solving assist level: Solves basic problems with no assist;Solves basic 90% of the time/requires cueing < 10% of the time  Memory Memory assist level: Recognizes or recalls 90% of the time/requires cueing < 10% of the time    Pain    Therapy/Group: Individual Therapy  [name] 06/06/2018, 12:26 PM

## 2018-06-06 NOTE — Progress Notes (Signed)
Occupational Therapy Session Note  Patient Details  Name: Frederick ForesterSteven Whitby Jr. MRN: [id number] Date of Birth: [birth year]  Today's Date: 06/06/2018 OT Individual Time: [id number] OT Individual Time Calculation (min): 71 min   Short Term Goals: Week 1:  OT Short Term Goal 1 (Week 1): Pt will be able to transfer to toilet with S with stand pivot. OT Short Term Goal 2 (Week 1): Pt will be able to complete 3/3 toileting tasks with S.  OT Short Term Goal 3 (Week 1): Pt will be able to complete room exercise program for his shoulders with no cues. OT Short Term Goal 4 (Week 1): Pt will demonstrate improved FMC to cut food with fork and knife.  Skilled Therapeutic Interventions/Progress Updates:    Pt greeted supine in bed. No c/o pain. Motivated to shower. Throughout tx, he completed functional transfers with RW and steady assist at ambulatory level. Bathing completed sit<stand from TTB. He wore shower cap, and bathed below shoulders with OT holding hand held shower hose. Steady assist overall. Afterwards he dressed EOB sit<stand. He required min vcs for sequencing and safe hand placement during sit<stand transitions with RW. Oral care/grooming tasks completed while standing at sink with steady assist. Mod A for low squat in order to retrieve fallen toothpaste with UE support on sink. Shaving and washing hair on Lt side of head (with damp cloth) completed with supervision. At end of tx pt was left with all needs within reach, safety belt fastened, and family present.   Therapy Documentation Precautions:  Precautions Precautions: Fall Precaution Comments: skull flap out on R Restrictions Weight Bearing Restrictions: No   ADL: ADL ADL Comments: refer to functional navigator :    See Function Navigator for Current Functional Status.   Therapy/Group: Individual Therapy  Frederick Molina 06/06/2018, 9:14 AM
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
## 2018-06-06 NOTE — Progress Notes (Signed)
Physical Therapy Session Note  Patient Details  Name: Frederick Molina. MRN: [id number] Date of Birth: October 02, 1986  Today's Date: 06/06/2018 PT Individual Time: 1500-1600 PT Individual Time Calculation (min): 60 min   Short Term Goals: Week 1:  PT Short Term Goal 1 (Week 1): Pt will ambulate x 100 ft with min A and LRAD consistently PT Short Term Goal 2 (Week 1): Pt will ascend/descend 4 6" stairs with 2 handrails min A PT Short Term Goal 3 (Week 1): Pt will propel w/c x 150 ft Mod I  Skilled Therapeutic Interventions/Progress Updates:   Pt in chair and agreeable to therapy, denies pain but reports stiffness globally. Ambulated to/from therapy gym w/ RW and min guard, verbal cues for increased BOS. Worked on gait training first part of session w/o AD. Ambulated 75' x4 w/ min-mod assist and verbal cues for gait pattern. Mod assist needed to correct 2-3 LOB, able to decrease frequency of LOB w/ verbal cues to increase BOS and to decrease gait speed. Otherwise, min guard to ambulate w/o AD. Worked on LE strengthening w/o UE support while performing partial knee bends 2x10, side steps 2x10 in each direction, and alternating step-ups to 6" step 2x20. Min guard for exercises, mod assist to correct 1 LOB w/ step-ups. Additionally performed trunk rotations w/ 2 kg medicine ball, 2x10, shoulder press w/ unweighted ball, 2x10, and shoulder flys back and forth w/ therapist, 3 minutes. Educated pt on getting necessary rest for muscle recovery as he is still early in recovery from his injury. Cautioned w/ over exerting himself as he is very motivated. Returned to room and ended session in chair and in care of family, all needs met.   Therapy Documentation Precautions:  Precautions Precautions: Fall Precaution Comments: skull flap out on R Restrictions Weight Bearing Restrictions: No Pain: Pain Assessment Pain Scale: 0-10 Pain Score: 0-No pain  See Function Navigator for Current Functional  Status.   Therapy/Group: Individual Therapy  Nickolette Espinola K Arnette 06/06/2018, 4:02 PM

## 2018-06-07 ENCOUNTER — Inpatient Hospital Stay (HOSPITAL_COMMUNITY): Payer: Self-pay | Admitting: Physical Therapy

## 2018-06-07 ENCOUNTER — Inpatient Hospital Stay (HOSPITAL_COMMUNITY): Payer: 59 | Admitting: Occupational Therapy

## 2018-06-07 NOTE — Progress Notes (Signed)
Occupational Therapy Session Note  Patient Details  Name: Frederick ForesterSteven Hazelett Jr. MRN: [id number] Date of Birth: Dec 25, 1985  Today's Date: 06/07/2018 OT Individual Time: [id number] OT Individual Time Calculation (min): 57 min   Short Term Goals: Week 1:  OT Short Term Goal 1 (Week 1): Pt will be able to transfer to toilet with S with stand pivot. OT Short Term Goal 2 (Week 1): Pt will be able to complete 3/3 toileting tasks with S.  OT Short Term Goal 3 (Week 1): Pt will be able to complete room exercise program for his shoulders with no cues. OT Short Term Goal 4 (Week 1): Pt will demonstrate improved FMC to cut food with fork and knife.  Skilled Therapeutic Interventions/Progress Updates:    Pt greeted in bed, finishing up breakfast. Agreeable to earlier than scheduled tx. He ambulated with RW and steady assist to bathroom and transferred to TTB. Mod vcs for sequencing of doffing clothing due to fatigue this AM. Shower cap was donned and pt then bathed below shoulders with OT managing hand held shower hose. He required steady assist overall (and able to dry off back himself using bilateral UEs!) Afterwards dressing was completed at sit<stand level with RW and min vcs for sequencing. Once oral care was completed while standing at sink, he transferred to recliner and drank 12 sips of water with supervision. Results recorded on his water protocol sheet. At end of tx pt was left with fiance.   Therapy Documentation Precautions:  Precautions Precautions: Fall Precaution Comments: skull flap out on R Restrictions Weight Bearing Restrictions: No Vital Signs: Therapy Vitals Temp: 97.9 F (36.6 C) Temp Source: Oral Pulse Rate: 92 Resp: 16 BP: 128/88 Patient Position (if appropriate): Sitting Oxygen Therapy SpO2: 97 % O2 Device: Room Air Pain: No c/o pain during session  Pain Assessment Pain Scale: 0-10 Pain Score: 0-No pain ADL: ADL ADL Comments: refer to functional navigator     See  Function Navigator for Current Functional Status.   Therapy/Group: Individual Therapy  Azhar Knope A Ralf Konopka 06/07/2018, 9:09 AM

## 2018-06-07 NOTE — Progress Notes (Signed)
Physical Therapy Session Note  Patient Details  Name: Frederick ForesterSteven Knipp Jr. MRN: [id number] Date of Birth: 1986-09-20  Today's Date: 06/07/2018 PT Individual Time: 1015-1100 PT Individual Time Calculation (min): 45 min   Short Term Goals: Week 1:  PT Short Term Goal 1 (Week 1): Pt will ambulate x 100 ft with min A and LRAD consistently PT Short Term Goal 2 (Week 1): Pt will ascend/descend 4 6" stairs with 2 handrails min A PT Short Term Goal 3 (Week 1): Pt will propel w/c x 150 ft Mod I  Skilled Therapeutic Interventions/Progress Updates:  Pt was seen bedside in the am. Pt performed multiple sit to stand transfers with S and stand pivot transfers with min guard and verbal cues. Pt ambulated 125 feet with min guard and rolling walker. Pt ambulated 125 and 200 feet without assistive device and min guard to min A with verbal cues. Treatment in gym focused on NMR utilizing cone taps, alternating cone taps, criss cross cone taps and alternating criss cross cone taps, 3 sets x 10 reps each. Pt returned to room following treatment and left sitting up in recliner with family at bedside.   Therapy Documentation Precautions:  Precautions Precautions: Fall Precaution Comments: skull flap out on R Restrictions Weight Bearing Restrictions: No General:   Pain: No c/o pain.   See Function Navigator for Current Functional Status.   Therapy/Group: Individual Therapy  [name], Violett Hobbs G 06/07/2018, 12:49 PM

## 2018-06-07 NOTE — Progress Notes (Signed)
Water protocol done by OT this morning.

## 2018-06-07 NOTE — Progress Notes (Signed)
Frederick ForesterSteven Sanks Jr. is a 32 y.o. male [birth year] [id number]  Subjective: No new complaints. No new problems. Slept well. Feeling OK.  Objective: Vital signs in last 24 hours: Temp:  [97.9 F (36.6 C)-99 F (37.2 C)] 97.9 F (36.6 C) (07/07 0511) Pulse Rate:  [88-102] 92 (07/07 0511) Resp:  [16-18] 16 (07/07 0511) BP: (128-132)/(78-91) 128/88 (07/07 0511) SpO2:  [97 %-100 %] 97 % (07/07 0511) Weight:  [149 lb 7.6 oz (67.8 kg)] 149 lb 7.6 oz (67.8 kg) (07/07 0506) Weight change: -11 lb 7.4 oz (-5.2 kg) Last BM Date: 06/06/18  Intake/Output from previous day: 07/06 0701 - 07/07 0700 In: 770 [P.O.:770] Out: -  Last cbgs: CBG (last 3)  No results for input(s): GLUCAP in the last 72 hours.   Physical Exam General: No apparent distress   HEENT: not dry Lungs: Normal effort. Lungs clear to auscultation, no crackles or wheezes. Cardiovascular: Regular rate and rhythm, no edema Abdomen: S/NT/ND; BS(+) Musculoskeletal:  unchanged Neurological: No new neurological deficits Wounds: Clean Skin: clear   Mental state: Alert, cooperative    Lab Results: BMET    Component Value Date/Time   NA 138 06/05/2018 0640   K 3.3 (L) 06/05/2018 0640   CL 105 06/05/2018 0640   CO2 25 06/05/2018 0640   GLUCOSE 96 06/05/2018 0640   BUN 5 (L) 06/05/2018 0640   CREATININE 0.57 (L) 06/05/2018 0640   CALCIUM 8.7 (L) 06/05/2018 0640   GFRNONAA >60 06/05/2018 0640   GFRAA >60 06/05/2018 0640   CBC    Component Value Date/Time   WBC 9.7 06/05/2018 0640   RBC 3.95 (L) 06/05/2018 0640   HGB 10.7 (L) 06/05/2018 0640   HCT 34.3 (L) 06/05/2018 0640   PLT 453 (H) 06/05/2018 0640   MCV 86.8 06/05/2018 0640   MCH 27.1 06/05/2018 0640   MCHC 31.2 06/05/2018 0640   RDW 18.1 (H) 06/05/2018 0640   LYMPHSABS 2.7 06/05/2018 0640   MONOABS 0.8 06/05/2018 0640   EOSABS 0.2 06/05/2018 0640   BASOSABS 0.0 06/05/2018 0640    Studies/Results: No results found.  Medications: I have reviewed the  patient's current medications.  Assessment/Plan:   1.  Polytrauma with TBI.  Mobility and endurance, strength, cognition, self-care deficiency.  CIR 2.  Bilateral leg DVT.  On Lovenox 3.  Pain management.  Wean off oxycodone as tolerated 4.  Dysphagia.  Advance diet as tolerated 5.  Delirium-on Seroquel at bedtime.  Klonopin twice daily 6.  Anemia.  Monitor hemoglobin 7.  Liver laceration.  We are monitoring liver function tests. 8.  Hypertension with tachycardia.  Likely reactive.  Will continue to monitor 9.  Hypokalemia.  It was replaced.      Length of stay, days: 3  Sonda PrimesAlex Brannon Levene , MD 06/07/2018, 10:09 AM

## 2018-06-08 ENCOUNTER — Inpatient Hospital Stay (HOSPITAL_COMMUNITY): Payer: Self-pay | Admitting: Speech Pathology

## 2018-06-08 ENCOUNTER — Inpatient Hospital Stay (HOSPITAL_COMMUNITY): Payer: 59 | Admitting: Physical Therapy

## 2018-06-08 ENCOUNTER — Inpatient Hospital Stay (HOSPITAL_COMMUNITY): Payer: Self-pay

## 2018-06-08 ENCOUNTER — Inpatient Hospital Stay (HOSPITAL_COMMUNITY): Payer: 59 | Admitting: Occupational Therapy

## 2018-06-08 ENCOUNTER — Inpatient Hospital Stay (HOSPITAL_COMMUNITY): Payer: 59

## 2018-06-08 LAB — BASIC METABOLIC PANEL
Anion gap: 9 (ref 5–15)
BUN: 7 mg/dL (ref 6–20)
CHLORIDE: 103 mmol/L (ref 98–111)
CO2: 27 mmol/L (ref 22–32)
CREATININE: 0.69 mg/dL (ref 0.61–1.24)
Calcium: 9.1 mg/dL (ref 8.9–10.3)
GFR calc Af Amer: >60 mL/min (ref >60)
Glucose, Bld: 85 mg/dL (ref 70–99)
POTASSIUM: 4.1 mmol/L (ref 3.5–5.1)
Sodium: 139 mmol/L (ref 135–145)

## 2018-06-08 MED ORDER — QUETIAPINE FUMARATE 50 MG PO TABS
150.0000 mg | ORAL_TABLET | Freq: Every day | ORAL | Status: DC
  Administered 2018-06-08: 150 mg via ORAL
  Filled 2018-06-08: qty 1

## 2018-06-08 NOTE — Progress Notes (Signed)
Speech Language Pathology Daily Session Note  Patient Details  Name: Frederick Molina. MRN: [id number] Date of Birth: 1986/03/22  Today's Date: 06/08/2018 SLP Individual Time: 2330-0762 SLP Individual Time Calculation (min): 30 min  Short Term Goals: Week 1: SLP Short Term Goal 1 (Week 1): Patient will consume trials of Dys. 2 textures with efficient mastication and complete oral clearing without overt s/s of aspiratoin with supervision verbal cues prior to upgrade.  SLP Short Term Goal 1 - Progress (Week 1): Met SLP Short Term Goal 2 (Week 1): Patient will consume trials of thin liquids via cup without overt s/s of aspiration and supervision verbal cues for use of small sips to assess readiness repeat objective swallow study.  SLP Short Term Goal 2 - Progress (Week 1): Met SLP Short Term Goal 3 (Week 1): Patient will demonstrate complex problem solving for functional tasks with supervision verbal cues.  SLP Short Term Goal 4 (Week 1): Patient will recall new, daily information with supervision verbal cues for use of strategies.  SLP Short Term Goal 5 (Week 1): Patient will self-monitor and correct errors during functional tasks with supervision verbal cues.  SLP Short Term Goal 6 (Week 1): Patient will utilize an increased vocal intensity at the conversation level to maximize intelligibility to 100% in a moderately distracting enviornment with supervision verbal cues.    Skilled Therapeutic Interventions: Skilled treatment session focused on dysphagia goals. SLP facilitated session by providing skilled observation with upgraded meal of Dys.3 textures with thin liquids. Patient required supervision verbal cues for use of swallowing compensatory strategies with overt cough X 2 when not utilizing the supraglottic swallow with liquids during a distracting environment. Patient with excellent awareness of need to minimize distractions to maximize swallow safety. Recommend patient continue current diet.   Patient left upright in recliner with family present. Continue with current plan of care.      Function:  Eating Eating   Modified Consistency Diet: Yes Eating Assist Level: Supervision or verbal cues           Cognition Comprehension Comprehension assist level: Follows basic conversation/direction with extra time/assistive device  Expression   Expression assist level: Expresses basic 90% of the time/requires cueing < 10% of the time.  Social Interaction Social Interaction assist level: Interacts appropriately 75 - 89% of the time - Needs redirection for appropriate language or to initiate interaction.  Problem Solving Problem solving assist level: Solves basic 75 - 89% of the time/requires cueing 10 - 24% of the time  Memory Memory assist level: Recognizes or recalls 75 - 89% of the time/requires cueing 10 - 24% of the time    Pain No/Denies Pain   Therapy/Group: Individual Therapy  Jelicia Nantz 06/08/2018, 1:05 PM

## 2018-06-08 NOTE — Progress Notes (Signed)
Physical Therapy Session Note  Patient Details  Name: Frederick ForesterSteven Aker Jr. MRN: [id number] Date of Birth: 20-Jun-1986  Today's Date: 06/08/2018 PT Individual Time: 1030-1100 PT Individual Time Calculation (min): 30 min   Short Term Goals: Week 1:  PT Short Term Goal 1 (Week 1): Pt will ambulate x 100 ft with min A and LRAD consistently PT Short Term Goal 2 (Week 1): Pt will ascend/descend 4 6" stairs with 2 handrails min A PT Short Term Goal 3 (Week 1): Pt will propel w/c x 150 ft Mod I  Skilled Therapeutic Interventions/Progress Updates:   Bed mobility with HOB raised, wtihout rails, supervision.  Pt stated that his balance is his biggest problem.  With external perurbations, pt demonstrated delayed L ankle strategy, absent bil hip strategy and delayed R stepping strategy.  With practice, bil hip stategy emerged, but still insufficient to avoid LOB backwards onto bed.  Gait training without AD with min/mod assist on level tile for oveshifting to R with resulting L scissoring.  neuromuscular re-education via forced use, positioning, multimodal cues for alternating reciprocal movement x bil LEs seated and standing on Kinetron at level 40 cm/sec. focus on wt shifitng to L, full extension LLE, upright trunk, forward gaze. L trunk elongation absent.  Pt passed to Clydie BraunKaren, PT for next session.     Therapy Documentation Precautions:  Precautions Precautions: Fall Precaution Comments: skull flap out on R Restrictions Weight Bearing Restrictions: No   Pain: none at rest; c/o some muscle soreness from previous session      See Function Navigator for Current Functional Status.   Therapy/Group: Individual Therapy  Frederick Molina 06/08/2018, 11:34 AM

## 2018-06-08 NOTE — Progress Notes (Signed)
Occupational Therapy Session Note  Patient Details  Name: Frederick Molina. MRN: [id number] Date of Birth: 17-Nov-1986  Today's Date: 06/08/2018 OT Individual Time: 9892-1194 OT Individual Time Calculation (min): 58 min   Short Term Goals: Week 1:  OT Short Term Goal 1 (Week 1): Pt will be able to transfer to toilet with S with stand pivot. OT Short Term Goal 2 (Week 1): Pt will be able to complete 3/3 toileting tasks with S.  OT Short Term Goal 3 (Week 1): Pt will be able to complete room exercise program for his shoulders with no cues. OT Short Term Goal 4 (Week 1): Pt will demonstrate improved [place] to cut food with fork and knife.  Skilled Therapeutic Interventions/Progress Updates:    Pt greeted semi-reclined in bed and agreeable to OT. Pt ambulated to bathroom with RW and CGA. Pt undressed seated on edge of tub bench. Pt donned shower cap with set-up A. Bathing completed with seupervision and CGA for balance when standing to wash buttocks. OT managed shower head throughout to protect scalp incision. Dressing completed EOB with increased time and set-up A. Verbal cues for modified strategy to tie shoes in figure 4 position. Worked on dynamic balance at the sink to stand and brush teeth with verbal cues for RW placement-overall supervision. Pt took seated rest break, then ambulated to day room with RW and CGA- intermittent min guard A for balance w/ LE scissoring. Pt completed 10 mins on [employer] arm bike on level 3.5 with 1 rest break in between. Pt ambulated back to room without AD and min HHA with intermittent scissoring. Pt returned to bed at end of session in preparation for MBS this morning. Pt left with bed alarm on and needs met.   Therapy Documentation Precautions:  Precautions Precautions: Fall Precaution Comments: skull flap out on R Restrictions Weight Bearing Restrictions: No Pain:  none/denies pain ADL: ADL ADL Comments: refer to functional navigator  See Function  Navigator for Current Functional Status.   Therapy/Group: Individual Therapy  Valma Cava 06/08/2018, 8:48 AM
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
## 2018-06-08 NOTE — Progress Notes (Signed)
Modified Barium Swallow Progress Note  Patient Details  Name: Frederick ForesterSteven Niebuhr Jr. MRN: [id number] Date of Birth: 1986-01-07  Today's Date: 06/08/2018  Modified Barium Swallow completed.  Full report located under Chart Review in the Imaging Section.  Brief recommendations include the following:  Clinical Impression  Patient demonstrates a mild pharyngeal dysphagia characterized by decreased laryngeal closure, suspect due to weak vocal folds after prolonged intubation resulting in mild penetration of thin liquids. However, penetration is eliminated with use of supraglottic swallow to maximize timing of laryngeal closure. Mild residuals remained above the CP segment, however, it was eliminated with use of multiple swallows.  Recommend patient upgrade to Dys. 3 textures with thin liquids with use of swallowing strategies. Educated both the patient and his mother of results and recommendations, both verbalized understanding.     Swallow Evaluation Recommendations       SLP Diet Recommendations: Dysphagia 3 (Mech soft) solids;Thin liquid   Liquid Administration via: Cup;No straw   Medication Administration: Whole meds with puree   Supervision: Patient able to self feed;Intermittent supervision to cue for compensatory strategies   Compensations: Slow rate;Small sips/bites;Multiple dry swallows after each bite/sip   Postural Changes: Seated upright at 90 degrees   Oral Care Recommendations: Oral care BID      Feliberto Gottronourtney Hajra [place], MA, CCC-SLP [phone]   Frederick Molina 06/08/2018,10:18 AM

## 2018-06-08 NOTE — Progress Notes (Signed)
Physical Therapy Session Note  Patient Details  Name: Frederick ForesterSteven Jurado Jr. MRN: [id number] Date of Birth: 07/20/1986  Today's Date: 06/08/2018 PT Individual Time: 1100-1143 PT Individual Time Calculation (min): 43 min   Short Term Goals: Week 1:  PT Short Term Goal 1 (Week 1): Pt will ambulate x 100 ft with min A and LRAD consistently PT Short Term Goal 2 (Week 1): Pt will ascend/descend 4 6" stairs with 2 handrails min A PT Short Term Goal 3 (Week 1): Pt will propel w/c x 150 ft Mod I  Skilled Therapeutic Interventions/Progress Updates:    Pt reports "stiff" back and asks for stretches to improve flexibility. Pt given handout with LTR and single knee to chest and double knee to chest and performs with visual cues.  Seated on theraball pt performs with min guard wt shifts laterally, A/P and pelvic circles on ball with min tactile cuing.  Seated and standing reaching with focus on trunk elongation on Lt.  Sit to stand repetitions with adductor squeeze 2 x 15.  Tall kneeling with trunk diagonals with min manual facilitation due to trunk stiffness.  Pt fatigued at end of session.  Gait with min A without device due to LE weakness and scissoring.  Pt left in recliner with chair alarm set and family present.  Therapy Documentation Precautions:  Precautions Precautions: Fall Precaution Comments: skull flap out on R Restrictions Weight Bearing Restrictions: No Pain:  no c/o pain   Therapy/Group: Individual Therapy  [name] 06/08/2018, 12:46 PM

## 2018-06-09 ENCOUNTER — Inpatient Hospital Stay (HOSPITAL_COMMUNITY): Payer: 59 | Admitting: Physical Therapy

## 2018-06-09 ENCOUNTER — Inpatient Hospital Stay (HOSPITAL_COMMUNITY): Payer: 59

## 2018-06-09 ENCOUNTER — Inpatient Hospital Stay (HOSPITAL_COMMUNITY): Payer: Self-pay | Admitting: Speech Pathology

## 2018-06-09 ENCOUNTER — Encounter (HOSPITAL_COMMUNITY): Payer: 59 | Admitting: Psychology

## 2018-06-09 LAB — CBC
HEMATOCRIT: 37.9 % — AB (ref 39.0–52.0)
Hemoglobin: 11.9 g/dL — ABNORMAL LOW (ref 13.0–17.0)
MCH: 27.6 pg (ref 26.0–34.0)
MCHC: 31.4 g/dL (ref 30.0–36.0)
MCV: 87.9 fL (ref 78.0–100.0)
Platelets: 430 10*3/uL — ABNORMAL HIGH (ref 150–400)
RBC: 4.31 MIL/uL (ref 4.22–5.81)
RDW: 17.7 % — AB (ref 11.5–15.5)
WBC: 6.3 10*3/uL (ref 4.0–10.5)

## 2018-06-09 LAB — BASIC METABOLIC PANEL
ANION GAP: 11 (ref 5–15)
BUN: 6 mg/dL (ref 6–20)
CO2: 25 mmol/L (ref 22–32)
Calcium: 9.3 mg/dL (ref 8.9–10.3)
Chloride: 102 mmol/L (ref 98–111)
Creatinine, Ser: 0.59 mg/dL — ABNORMAL LOW (ref 0.61–1.24)
GFR calc Af Amer: >60 mL/min (ref >60)
GLUCOSE: 93 mg/dL (ref 70–99)
POTASSIUM: 4 mmol/L (ref 3.5–5.1)
Sodium: 138 mmol/L (ref 135–145)

## 2018-06-09 MED ORDER — QUETIAPINE FUMARATE 100 MG PO TABS
100.0000 mg | ORAL_TABLET | Freq: Every day | ORAL | Status: DC
  Administered 2018-06-09 – 2018-06-10 (×2): 100 mg via ORAL
  Filled 2018-06-09 (×2): qty 1
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
## 2018-06-09 NOTE — Progress Notes (Signed)
Physical Therapy Session Note  Patient Details  Name: Frederick ForesterSteven Montanye Jr. MRN: [id number] Date of Birth: 29-Jan-1986  Today's Date: 06/09/2018 PT Individual Time: 1600-1650 PT Individual Time Calculation (min): 50 min   Short Term Goals: Week 1:  PT Short Term Goal 1 (Week 1): Pt will ambulate x 100 ft with min A and LRAD consistently PT Short Term Goal 2 (Week 1): Pt will ascend/descend 4 6" stairs with 2 handrails min A PT Short Term Goal 3 (Week 1): Pt will propel w/c x 150 ft Mod I  Skilled Therapeutic Interventions/Progress Updates:   Pt in recliner and agreeable to therapy, denies pain. 1st half of session focused on community mobility. Ambulated to outside w/ min guard, >150' each way. Pt ambulated in busier and unfamiliar environment at a self-selected slow gait speed for safety. Additionally negotiated uneven surfaces w/ min guard and verbal cues for safety. Returned to unit and instructed and performed LE and back stretching exercises in supine and seated for relief of occasional back spasms and to work on overall flexibility. Performed trunk rotations in hooklying, supine and seated butterfly, supine glut stretch, supine and seated knees to chest, and seated hamstring and gastroc stretches. Pt demo-ed correctly, safely, and w/o pain. Ended session in recliner, heat applied to low back for back spasm prophylaxis. In care of mother and all needs in reach.   Therapy Documentation Precautions:  Precautions Precautions: Fall Precaution Comments: skull flap out on R Restrictions Weight Bearing Restrictions: No Vital Signs: Therapy Vitals Temp: 98.7 F (37.1 C) Temp Source: Oral Pulse Rate: (!) 102 Resp: 18 BP: 122/71 Patient Position (if appropriate): Sitting Oxygen Therapy SpO2: 99 % O2 Device: Room Air  See Function Navigator for Current Functional Status.   Therapy/Group: Individual Therapy  Santana Edell K Arnette 06/09/2018, 4:59 PM

## 2018-06-09 NOTE — Progress Notes (Signed)
Speech Language Pathology Daily Session Notes  Patient Details  Name: Frederick Molina. MRN: [id number] Date of Birth: 04-06-1986  Today's Date: 06/09/2018  Session 1: SLP Individual Time: 0725-0825 SLP Individual Time Calculation (min): 60 min   Session 2: SLP Individual Time: 1300-1330 SLP Individual Time Calculation (min): 30 min  Short Term Goals: Week 1: SLP Short Term Goal 1 (Week 1): Patient will consume trials of Dys. 2 textures with efficient mastication and complete oral clearing without overt s/s of aspiratoin with supervision verbal cues prior to upgrade.  SLP Short Term Goal 1 - Progress (Week 1): Met SLP Short Term Goal 2 (Week 1): Patient will consume trials of thin liquids via cup without overt s/s of aspiration and supervision verbal cues for use of small sips to assess readiness repeat objective swallow study.  SLP Short Term Goal 2 - Progress (Week 1): Met SLP Short Term Goal 3 (Week 1): Patient will demonstrate complex problem solving for functional tasks with supervision verbal cues.  SLP Short Term Goal 4 (Week 1): Patient will recall new, daily information with supervision verbal cues for use of strategies.  SLP Short Term Goal 5 (Week 1): Patient will self-monitor and correct errors during functional tasks with supervision verbal cues.  SLP Short Term Goal 6 (Week 1): Patient will utilize an increased vocal intensity at the conversation level to maximize intelligibility to 100% in a moderately distracting enviornment with supervision verbal cues.    Skilled Therapeutic Interventions:  Session 1: Skilled treatment session focused on dysphagia and cognitive goals. SLP facilitated session by providing skilled observation with breakfast meal of Dys. 3 textures with thin liquids. Patient consumed meal without overt s/s of aspiration and was Mod I for use of swallowing compensatory strategies. Recommend patient continue current diet with intermittent supervision. SLP also  facilitated session by administering the MoCA (version 8.3). Patient scored 21/30 points with a score of 26 or above considered normal. Patient demonstrates deficits in short-term recall and executive functioning. Patient verbalized understanding of results of testing and appeared disappointed in performance, SLP provided support and encouragement. Patient left upright in bed with alarm on and family present. Continue with current plan of care.   Session 2: Skilled treatment session focused on cognitive goals. SLP facilitated by providing extra time and Min A verbal cues for problem solving during a basic money management task. Patient ambulated to and from the SLP office with Min guard and required Min verbal cues to avoid obstacles in right field of environment. Patient continues to appear "dissappointed" with his cognitive performance but SLP continues to provide encouragement and support. Patient left upright in recliner with all needs within reach and family present. Continue with current plan of care.   Function:  Eating Eating   Modified Consistency Diet: Yes Eating Assist Level: Swallowing techniques: self managed           Cognition Comprehension Comprehension assist level: Follows basic conversation/direction with extra time/assistive device  Expression   Expression assist level: Expresses basic 90% of the time/requires cueing < 10% of the time.  Social Interaction Social Interaction assist level: Interacts appropriately 75 - 89% of the time - Needs redirection for appropriate language or to initiate interaction.  Problem Solving Problem solving assist level: Solves basic 90% of the time/requires cueing < 10% of the time  Memory Memory assist level: Recognizes or recalls 75 - 89% of the time/requires cueing 10 - 24% of the time    Pain Pain Assessment Pain Scale:  0-10 Pain Score: 0-No pain  Therapy/Group: Individual Therapy  Nao Linz 06/09/2018, 10:32 AM

## 2018-06-09 NOTE — Progress Notes (Addendum)
Occupational Therapy Session Note  Patient Details  Name: Frederick ForesterSteven Pun Jr. MRN: [id number] Date of Birth: 1986-06-08  Today's Date: 06/09/2018 OT Individual Time: 0930-1015 Session 2: 1123-1150  OT Individual Time Calculation (min): 45 min Session 2: 27 min   Short Term Goals: Week 1:  OT Short Term Goal 1 (Week 1): Pt will be able to transfer to toilet with S with stand pivot. OT Short Term Goal 2 (Week 1): Pt will be able to complete 3/3 toileting tasks with S.  OT Short Term Goal 3 (Week 1): Pt will be able to complete room exercise program for his shoulders with no cues. OT Short Term Goal 4 (Week 1): Pt will demonstrate improved FMC to cut food with fork and knife.  Skilled Therapeutic Interventions/Progress Updates:    Session 1: Pt supine in bed with no c/o pain requesting to shower. Session focused on safety awareness during b/d tasks at shower level. Pt completed functional mobility to shower, using RW, requiring (S) to transfer to TTB. Pt required vc for termination of task during bathing, however completed UB/LB with (S) overall. Pt demo good safety awareness to don LB clothing in sitting. Pt completed oral care in standing with (S). Pt then completed 40 ft of functional mobility into dayroom without AD, at close (S). Pt c/o tightness in B LE and completed 8 min on the Nustep at level 3 for increase B LE flexibility. Pt returned to room and was left sitting up in recliner with fiance and telesitter present.    Session 2:  Pt sitting up in recliner with no c/o pain and agreeable to therapy. Pt completed 100 ft of functional mobility without AD down to therapy gym with close (S). Pt completed 3x 10 sets of sit to stand transfers holding a 7 lb dowel at chest level and standing on a foam board. CGA provided throughout and vc for increasing pace and correcting form/body mechanics throughout. Pt then completed functional mobility without AD around cones on the floor to increase maneuvering  around obstacles in the home/community and to increase safety awareness overall. Lastly, pt transitioned to prone on the mat and completed several spinal extension exercises. Pt's back extremely tight and experiencing spasms, requiring vc for breathing techniques/relaxation, as well as a heat pack applied. Pt returned to room and left sitting up in recliner with telesitter and fiance present.   Therapy Documentation Precautions:  Precautions Precautions: Fall Precaution Comments: skull flap out on R Restrictions Weight Bearing Restrictions: No Pain: Pain Assessment Pain Scale: 0-10 Pain Score: 0-No pain ADL: ADL ADL Comments: refer to functional navigator  See Function Navigator for Current Functional Status.   Therapy/Group: Individual Therapy  Crissie ReeseSandra H Eagle Pitta 06/09/2018, 10:19 AM
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
## 2018-06-09 NOTE — Progress Notes (Signed)
Physical Therapy Session Note  Patient Details  Name: Frederick ForesterSteven Rafalski Jr. MRN: [id number] Date of Birth: Jan 19, 1986  Today's Date: 06/09/2018 PT Individual Time: [id number] PT Individual Time Calculation (min): 43 min   Short Term Goals: Week 1:  PT Short Term Goal 1 (Week 1): Pt will ambulate x 100 ft with min A and LRAD consistently PT Short Term Goal 2 (Week 1): Pt will ascend/descend 4 6" stairs with 2 handrails min A PT Short Term Goal 3 (Week 1): Pt will propel w/c x 150 ft Mod I  Skilled Therapeutic Interventions/Progress Updates:  Pt presented in recliner with fiancee present agreeable to therapy. [facility] no AD with min guard and was able to demonstrate and increased BOS with decreased episodes of scissoring. Pt participated in Dynavision mode B no AD on level tile and while standing on Airex. Pt was able to complete x 4 trials with x 1 posterior LOB requiring minA for correction. Pt ambulated to day room and participated in Biodex LOS and catch game on static level. Pt initially required mod tactile cues for increasing hip strategy however progressed from minA to min guard with duration and moderate challenge. Performed gait exercise weaving through cones with min guard and ambulated back to room min guard. Pt returned to recliner and positioned for comfort with call bell within reach and fiancee present.      Therapy Documentation Precautions:  Precautions Precautions: Fall Precaution Comments: skull flap out on R Restrictions Weight Bearing Restrictions: No General:   Vital Signs: Therapy Vitals Pulse Rate: (!) 112 BP: 114/80 Pain: Pain Assessment Pain Scale: 0-10 Pain Score: 0-No pain   See Function Navigator for Current Functional Status.   Therapy/Group: Individual Therapy  Latrell Potempa  Circe Chilton, PTA  06/09/2018, 12:34 PM
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
## 2018-06-09 NOTE — Care Management (Addendum)
[facility] Individual Statement of Services  Patient Name:  Frederick ForesterSteven Mcbrearty Jr.  Date:  06/08/2018  Welcome to the [facility]patient [facility].  Our goal is to provide you with an individualized program based on your diagnosis and situation, designed to meet your specific needs.  With this comprehensive rehabilitation program, you will be expected to participate in at least 3 hours of rehabilitation therapies Monday-Friday, with modified therapy programming on the weekends.  Your rehabilitation program will include the following services:  Physical Therapy (PT), Occupational Therapy (OT), Speech Therapy (ST), 24 hour per day rehabilitation nursing, Therapeutic Recreaction (TR), Neuropsychology, Case Management (Social Worker), Rehabilitation Medicine, Nutrition Services and Pharmacy Services  Weekly team conferences will be held on Tuesdays to discuss your progress.  Your Social Worker will talk with you frequently to get your input and to update you on team discussions.  Team conferences with you and your family in attendance may also be held.  Expected length of stay: 14-16 days   Overall anticipated outcome: supervision  Depending on your progress and recovery, your program may change. Your Social Worker will coordinate services and will keep you informed of any changes. Your Social Worker's name and contact numbers are listed  below.  The following services may also be recommended but are not provided by the [facility]patient [facility]:   Driving Evaluations  Home Health Rehabiltiation Services  Outpatient Rehabilitation Services  Vocational Rehabilitation   Arrangements will be made to provide these services after discharge if needed.  Arrangements include referral to agencies that provide these services.  Your insurance has been verified to be:  None Barrister's clerk(Financial Counseling Dept following account)  Your primary doctor is:  None (social worker will assist  with establishing primary medical care provider)  Pertinent information will be shared with your doctor and your insurance company.  Social Worker:  ClarenceLucy Zafar [place], [place] [phone] or [phone]) [phone]   Information discussed with and copy given to patient by: Amada JupiterHOYLE, Danelly Hassinger, 06/08/2018, 12:26 PM

## 2018-06-10 ENCOUNTER — Inpatient Hospital Stay (HOSPITAL_COMMUNITY): Payer: 59 | Admitting: Occupational Therapy

## 2018-06-10 ENCOUNTER — Inpatient Hospital Stay (HOSPITAL_COMMUNITY): Payer: Self-pay | Admitting: Speech Pathology

## 2018-06-10 ENCOUNTER — Inpatient Hospital Stay (HOSPITAL_COMMUNITY): Payer: 59 | Admitting: Physical Therapy

## 2018-06-10 ENCOUNTER — Inpatient Hospital Stay (HOSPITAL_COMMUNITY): Payer: Self-pay | Admitting: Occupational Therapy

## 2018-06-10 MED ORDER — CLONAZEPAM 0.25 MG PO TBDP
0.2500 mg | ORAL_TABLET | Freq: Two times a day (BID) | ORAL | Status: DC
  Administered 2018-06-10 – 2018-06-12 (×4): 0.25 mg via ORAL
  Filled 2018-06-10 (×4): qty 1

## 2018-06-10 MED ORDER — SODIUM CHLORIDE 0.9 % IV SOLN: Freq: Once | INTRAVENOUS | Status: DC

## 2018-06-10 NOTE — Progress Notes (Signed)
Speech Language Pathology Daily Session Note  Patient Details  Name: Frederick Molina. MRN: [id number] Date of Birth: [birth year]  Today's Date: 06/10/2018 SLP Individual Time: 1400-1500 SLP Individual Time Calculation (min): 60 min  Short Term Goals: Week 1: SLP Short Term Goal 1 (Week 1): Patient will consume trials of Dys. 2 textures with efficient mastication and complete oral clearing without overt s/s of aspiratoin with supervision verbal cues prior to upgrade.  SLP Short Term Goal 1 - Progress (Week 1): Met SLP Short Term Goal 2 (Week 1): Patient will consume trials of thin liquids via cup without overt s/s of aspiration and supervision verbal cues for use of small sips to assess readiness repeat objective swallow study.  SLP Short Term Goal 2 - Progress (Week 1): Met SLP Short Term Goal 3 (Week 1): Patient will demonstrate complex problem solving for functional tasks with supervision verbal cues.  SLP Short Term Goal 4 (Week 1): Patient will recall new, daily information with supervision verbal cues for use of strategies.  SLP Short Term Goal 5 (Week 1): Patient will self-monitor and correct errors during functional tasks with supervision verbal cues.  SLP Short Term Goal 6 (Week 1): Patient will utilize an increased vocal intensity at the conversation level to maximize intelligibility to 100% in a moderately distracting enviornment with supervision verbal cues.    Skilled Therapeutic Interventions: Skilled treatment session focused on cognitive goals. SLP facilitated session by providing extra time and supervision verbal cues for problem solving during a mildly complex, novel card task. Patient asking appropriate questions in regards to current cognitive functioning and recovery and SLP answered all questions and provided patient with TBI handbook. Patient left upright in recliner with all needs within reach and family present. Continue with current plan of care.       Function:   Cognition Comprehension Comprehension assist level: Follows basic conversation/direction with extra time/assistive device  Expression   Expression assist level: Expresses basic 90% of the time/requires cueing < 10% of the time.  Social Interaction Social Interaction assist level: Interacts appropriately 75 - 89% of the time - Needs redirection for appropriate language or to initiate interaction.  Problem Solving Problem solving assist level: Solves basic 75 - 89% of the time/requires cueing 10 - 24% of the time  Memory Memory assist level: Recognizes or recalls 50 - 74% of the time/requires cueing 25 - 49% of the time    Pain Pain Assessment Pain Score: 0-No pain  Therapy/Group: Individual Therapy  Frederick Molina 06/10/2018, 3:01 PM

## 2018-06-10 NOTE — Progress Notes (Signed)
Physical Therapy Session Note  Patient Details  Name: Frederick Molina. MRN: [id number] Date of Birth: Jul 25, 1986  Today's Date: 06/10/2018 PT Individual Time: 0805-0900 PT Individual Time Calculation (min): 55 min   Short Term Goals: Week 1:  PT Short Term Goal 1 (Week 1): Pt will ambulate x 100 ft with min A and LRAD consistently PT Short Term Goal 2 (Week 1): Pt will ascend/descend 4 6" stairs with 2 handrails min A PT Short Term Goal 3 (Week 1): Pt will propel w/c x 150 ft Mod I  Skilled Therapeutic Interventions/Progress Updates:   Pt received supine in bed and agreeable to PT. Supine>sit transfer without assist or cues from PT. Pt donned shoes sitting EOB with distant supervision with cues for safety awareness.   Gait training through rehab unit 2 x 188f with min-supervision assist from PT.   Dynamic balance/gait training through step ladder:  One foot in each space x 4. Side stepping x 2 Bil Side stepping in/out x 2 bil SLS in each space with 3 sec hold x 4  PT provided min assist throughout with min cues for use of ankle and steppnig strategy to prevent LOB.   Biodex dynamic balance training:  LOS easy x 2, Moderate x 2.  Maze control easy x 3.  Supervision-CGA from PT throughout to improve use of ankle strategy to shift weight and reduce compensatory rotational strategies through trunk.   Nustep BLE endurance and reciprocal movement training x 6 min with supervision assist from PT for proper speed and ROM.   Patient returned to room and left sitting in chair with call bell in reach and all needs met.            Therapy Documentation Precautions:  Precautions Precautions: Fall Precaution Comments: skull flap out on R Restrictions Weight Bearing Restrictions: No    Vital Signs: Therapy Vitals Temp: 98.5 F (36.9 C) Temp Source: Oral Pulse Rate: 90 Resp: 18 BP: 109/71 Patient Position (if appropriate): Lying Oxygen Therapy SpO2: 100 % O2 Device:  Room Air Pain:   denies  See Function Navigator for Current Functional Status.   Therapy/Group: Individual Therapy  ALorie Phenix7/09/2018, 8:59 AM
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
## 2018-06-10 NOTE — Progress Notes (Signed)
Occupational Therapy Session Note  Patient Details  Name: Frederick ForesterSteven Hritz Jr. MRN: [id number] Date of Birth: 22-Aug-1986  Today's Date: 06/10/2018 OT Individual Time: [id number] OT Individual Time Calculation (min): 45 min  and Today's Date: 06/10/2018 OT Missed Time: 15 Minutes Missed Time Reason: Other (comment)(disability appointment)   Short Term Goals: Week 1:  OT Short Term Goal 1 (Week 1): Pt will be able to transfer to toilet with S with stand pivot. OT Short Term Goal 2 (Week 1): Pt will be able to complete 3/3 toileting tasks with S.  OT Short Term Goal 3 (Week 1): Pt will be able to complete room exercise program for his shoulders with no cues. OT Short Term Goal 4 (Week 1): Pt will demonstrate improved FMC to cut food with fork and knife.  Skilled Therapeutic Interventions/Progress Updates:    Upon entering the room, pt seated in room with mother present with no c/o pain. Pt reports, " I'm ready for this shower." Pt ambulating with close supervision to dresser to obtain clothing items and taking them into bathroom without use of AD. Pt standing for toileting needs with close supervision for balance. Pt transferred onto TTB with min verbal cues for safety awareness with sit <>stand to remove clothing items for shower. Pt bathing from seated position on TTB and standing only to wash buttocks while holding onto grab bar and close supervision. Pt notified OT when he was standing as well. Pt requesting to don clothing items from seated position on edge of TTB. Pt required steady assistance for balance with LB clothing management. Pt able to don B shoes and fasten as well. Pt ambulating to stand at sink and brush teeth with overall supervision as well. Visitor present to assist pt and caregiver with disability application. 15 missed minutes of OT intervention.   Therapy Documentation Precautions:  Precautions Precautions: Fall Precaution Comments: skull flap out on R Restrictions Weight  Bearing Restrictions: No General: General OT Amount of Missed Time: 15 Minutes Vital Signs: Therapy Vitals Pulse Rate: (!) 111 BP: 117/83 Pain:   ADL: ADL ADL Comments: refer to functional navigator  See Function Navigator for Current Functional Status.   Therapy/Group: Individual Therapy  Alen BleacherBradsher, Laiken Nohr P 06/10/2018, 10:30 AM

## 2018-06-11 ENCOUNTER — Inpatient Hospital Stay (HOSPITAL_COMMUNITY): Payer: 59 | Admitting: Physical Therapy

## 2018-06-11 ENCOUNTER — Inpatient Hospital Stay (HOSPITAL_COMMUNITY): Payer: 59

## 2018-06-11 ENCOUNTER — Inpatient Hospital Stay (HOSPITAL_COMMUNITY): Payer: Self-pay | Admitting: Speech Pathology

## 2018-06-11 MED ORDER — QUETIAPINE FUMARATE 50 MG PO TABS
50.0000 mg | ORAL_TABLET | Freq: Every day | ORAL | Status: DC
  Administered 2018-06-11 – 2018-06-12 (×2): 50 mg via ORAL
  Filled 2018-06-11 (×2): qty 1

## 2018-06-11 MED ORDER — ACETAMINOPHEN 325 MG PO TABS: 325.0000 mg | ORAL_TABLET | ORAL | Status: AC | PRN

## 2018-06-11 NOTE — Progress Notes (Signed)
Speech Language Pathology Weekly Progress and Session Note  Patient Details  Name: Frederick Molina. MRN: [id number] Date of Birth: 1986/06/30  Beginning of progress report period: June 05, 2018 End of progress report period: June 11, 2018  Today's Date: 06/11/2018 SLP Individual Time: 1000-1100 SLP Individual Time Calculation (min): 60 min  Short Term Goals: Week 1: SLP Short Term Goal 1 (Week 1): Patient will consume trials of Dys. 2 textures with efficient mastication and complete oral clearing without overt s/s of aspiratoin with supervision verbal cues prior to upgrade.  SLP Short Term Goal 1 - Progress (Week 1): Met SLP Short Term Goal 2 (Week 1): Patient will consume trials of thin liquids via cup without overt s/s of aspiration and supervision verbal cues for use of small sips to assess readiness repeat objective swallow study.  SLP Short Term Goal 2 - Progress (Week 1): Met SLP Short Term Goal 3 (Week 1): Patient will demonstrate complex problem solving for functional tasks with supervision verbal cues.  SLP Short Term Goal 3 - Progress (Week 1): Met SLP Short Term Goal 4 (Week 1): Patient will recall new, daily information with supervision verbal cues for use of strategies.  SLP Short Term Goal 4 - Progress (Week 1): Not met SLP Short Term Goal 5 (Week 1): Patient will self-monitor and correct errors during functional tasks with supervision verbal cues.  SLP Short Term Goal 5 - Progress (Week 1): Met SLP Short Term Goal 6 (Week 1): Patient will utilize an increased vocal intensity at the conversation level to maximize intelligibility to 100% in a moderately distracting enviornment with supervision verbal cues.   SLP Short Term Goal 6 - Progress (Week 1): Met    New Short Term Goals: Week 2: SLP Short Term Goal 1 (Week 2): STG=LTGs  Weekly Progress Updates: Patient has made excellent gains and has met 5 of 6 STGs this reporting period. Currently, patient demonstrates behaviors  consistent with a Rancho Level VII-emerging VIII and requires overall supervision-Min A verbal cues to complete functional and mildly complex tasks safely in regards to awareness, recall and problem solving. Patient demonstrates improved vocal intensity and requires overall supervision verbal cues for use of an increased vocal intensity, however, patient remains 100% intelligible at the conversation level. Patient is currently consuming Dys. 3 textures with thin liquids with minimal overt s/s of aspiration and is Mod I for use of swallowing compensatory strategies with plans to upgrade to regular textures. Patient and family education ongoing. Patient would benefit from continued skilled SLP intervention to maximize his cognitive and swallowing function prior to discharge in order to maximize his overall functional independence.      Intensity: Minumum of 1-2 x/day, 30 to 90 minutes Frequency: 3 to 5 out of 7 days Duration/Length of Stay: 06/18/18 Treatment/Interventions: Cognitive remediation/compensation;Environmental controls;Internal/external aids;Speech/Language facilitation;Therapeutic Activities;Patient/family education;Functional tasks;Dysphagia/aspiration precaution training;Cueing hierarchy   Daily Session  Skilled Therapeutic Interventions: Skilled treatment session focused on dysphagia and cognitive goals. SLP facilitated session by providing skilled observation with trials of regular textures. Patient demonstrated efficient mastication with complete oral clearance and was Mod I for use of swallowing strategies. Patient with one overt cough, however, appeared unrelated to trials. Recommend trial tray prior to upgrade. SLP also facilitated session by providing extra time and supervision verbal cues for problem solving during a mildly complex organization task. Patient left upright in recliner with family present. Continue with current plan of care.       Function:  Cognition Comprehension Comprehension assist level: Follows basic conversation/direction with extra time/assistive device  Expression   Expression assist level: Expresses basic needs/ideas: With no assist  Social Interaction Social Interaction assist level: Interacts appropriately with others with medication or extra time (anti-anxiety, antidepressant).  Problem Solving Problem solving assist level: Solves complex 90% of the time/cues < 10% of the time  Memory Memory assist level: Recognizes or recalls 75 - 89% of the time/requires cueing 10 - 24% of the time   Pain No/Denies Pain   Therapy/Group: Individual Therapy  Elven Laboy 06/11/2018, 12:26 PM
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
## 2018-06-11 NOTE — Plan of Care (Signed)
  Problem: RH SAFETY Goal: RH STG ADHERE TO SAFETY PRECAUTIONS W/ASSISTANCE/DEVICE Description STG Adhere to Safety Precautions With mod I Assistance/Device.  Outcome: Progressing  Call light within reach, bed/chair alarm on, family at bedside, proper foot wear.
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
## 2018-06-11 NOTE — Progress Notes (Signed)
Physical Therapy Session Note  Patient Details  Name: Frederick ForesterSteven Gains Jr. MRN: [id number] Date of Birth: [birth year]  Today's Date: 06/11/2018 PT Individual Time: 1130-1155 PT Individual Time Calculation (min): 25 min   Short Term Goals: Week 1:  PT Short Term Goal 1 (Week 1): Pt will ambulate x 100 ft with min A and LRAD consistently PT Short Term Goal 2 (Week 1): Pt will ascend/descend 4 6" stairs with 2 handrails min A PT Short Term Goal 3 (Week 1): Pt will propel w/c x 150 ft Mod I  Skilled Therapeutic Interventions/Progress Updates:    pt session focused on standing balance and coordination with wii bowling game.  Pt able to problem solve use of controller for playing game with increased time, supervision, standing balance with min guard during game.  Gait throughout unit with min guard/min A for gait in home and controlled environments. Pt left in room with family present, needs at hand.  Therapy Documentation Precautions:  Precautions Precautions: Fall Precaution Comments: skull flap out on R Restrictions Weight Bearing Restrictions: No Pain:  no c/o pain   Therapy/Group: Individual Therapy  [name] 06/11/2018, 11:56 AM

## 2018-06-11 NOTE — Progress Notes (Signed)
Physical Therapy Session Note  Patient Details  Name: Frederick ForesterSteven Switala Jr. MRN: [id number] Date of Birth: 28-Apr-1986  Today's Date: 06/11/2018 PT Individual Time: [id number] PT Individual Time Calculation (min): 75 min   Short Term Goals: Week 1:  PT Short Term Goal 1 (Week 1): Pt will ambulate x 100 ft with min A and LRAD consistently PT Short Term Goal 2 (Week 1): Pt will ascend/descend 4 6" stairs with 2 handrails min A PT Short Term Goal 3 (Week 1): Pt will propel w/c x 150 ft Mod I  Skilled Therapeutic Interventions/Progress Updates:    pt request to shower this session. Pt ambulated in/out of bathroom and performed functional transfers throughout session at supervision level including transfer to tub transfer bench in shower. Pt able to verbalize and instruct PT in set-up of shower (use of towels and location for safety on wet floor) and performed bathing at shower level and then dressing at supervision level. Pt performed undressing and dressing seated position except sit <> stand for pulling up pants with overall supervision. Pt able to gait on unit at overall supervision level with occasional cues for attention to obstacles without use of AD. Transferred onto floor with supervision and instructed in quadruped position for yoga stretches for low back and progression for balance/core strengthening including: (handout also issued for HEP)  Exercises  Bird Dog - 10 reps - 2 sets - 2x daily  Child's Pose Stretch - 3 reps - 1 sets - 30-60 hold - 2x daily  Cat-Camel - 3 reps - 1 sets - 10 hold - 2x daily  Child's Pose with Sidebending - 3 reps - 1 sets - 30 hold - 1x daily   Cues provided for technique and importance of continued mobility upon d/c. Pt appears very motivated and reports he has noticed an improvement in back pain and mobility with addition of stretching to his routine. Pt performed 3 sets each side of supine knee to chest stretches and hamstring stretch in long-sitting position as  well. NMR for higher level balance and dual task activities during gait to challenge balance and cognition including holding stacked cups on tray and increasing challenge by adding cognitive demand (naming letters of alphabet skipping each one) which noted pt to have decreased velocity and more difficulty with accuracy as activity continued. Also challenged balance with similar activity when walking back to room while throwing and catching a ball and performing numerical cognitive challenge.    Therapy Documentation Precautions:  Precautions Precautions: Fall Precaution Comments: skull flap out on R Restrictions Weight Bearing Restrictions: No  Pain:  Denies pain except some tightness in low back. Stretching and exercises performed during session and applied heating pad at end of session.   See Function Navigator for Current Functional Status.   Therapy/Group: Individual Therapy  Karolee StampsGray, Haeden Hudock Darrol PokeBrescia  Kabella Cassidy B. Lorenia Hoston, PT, DPT  06/11/2018, 2:33 PM

## 2018-06-11 NOTE — Progress Notes (Signed)
Social Work Patient ID: Frederick ForesterSteven Wohlers Jr., male   DOB: Mar 11, 1986, 32 y.o.   MRN: [id number]   Have reviewed team conference with pt and mother.  Both aware and agreeable with targeted d/c date 7/18 and supervision/ independent goals.  Discussed need for follow up services, but this may be limited to [employer] as he is currently still uninsured - will discuss with therapies.  Continue to follow.  Soham Hollett, LCSW

## 2018-06-11 NOTE — Progress Notes (Signed)
Physical Therapy Session Note  Patient Details  Name: Frederick Molina. MRN: [id number] Date of Birth: Dec 09, 1985  Today's Date: 06/11/2018 PT Individual Time: 0915-1000 PT Individual Time Calculation (min): 45 min   Short Term Goals: Week 1:  PT Short Term Goal 1 (Week 1): Pt will ambulate x 100 ft with min A and LRAD consistently PT Short Term Goal 2 (Week 1): Pt will ascend/descend 4 6" stairs with 2 handrails min A PT Short Term Goal 3 (Week 1): Pt will propel w/c x 150 ft Mod I  Skilled Therapeutic Interventions/Progress Updates:    Pt received seated in bed, agreeable to PT. No complaints of pain but does report some soreness in back. Pt reports he is comfortable performing stretches for his back on his own time after therapy session. Bed mobility Supervision. Sit to stand with Supervision. Ambulation x 150 ft (+) throughout therapy session with no AD and Supervision. Standing balance: side-steps with orange theraband 2 x 15 ft L/R, monster walks 2 x 15 ft with OTB, backwards amb x 15 ft with CGA, side-steps with ball toss while engaging in cognitive task, grapevine 2 x 15 ft L/R, standing ball kick with focus on SLS and stopping ball before kicking it back to therapist. Biodex catch game with focus on use of hip strategy for balance, Biodex limits of stability level 2 with focus on improving control of weight shift with no UE support and CGA, pt scores 50%, 60%, and 66%. Pt left seated in recliner in room with needs in reach and quick-release belt in place.  Therapy Documentation Precautions:  Precautions Precautions: Fall Precaution Comments: skull flap out on R Restrictions Weight Bearing Restrictions: No  See Function Navigator for Current Functional Status.   Therapy/Group: Individual Therapy  Peter Congoaylor Wanya Bangura, PT, DPT  06/11/2018, 12:09 PM

## 2018-06-12 ENCOUNTER — Inpatient Hospital Stay (HOSPITAL_COMMUNITY): Payer: Self-pay | Admitting: Speech Pathology

## 2018-06-12 ENCOUNTER — Inpatient Hospital Stay (HOSPITAL_COMMUNITY): Payer: 59

## 2018-06-12 ENCOUNTER — Inpatient Hospital Stay (HOSPITAL_COMMUNITY): Payer: 59 | Admitting: Speech Pathology

## 2018-06-12 ENCOUNTER — Inpatient Hospital Stay (HOSPITAL_COMMUNITY): Payer: 59 | Admitting: Physical Therapy

## 2018-06-12 MED ORDER — GUAIFENESIN 100 MG/5ML PO SOLN: 5.0000 mL | Freq: Four times a day (QID) | ORAL | Status: DC | PRN
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
## 2018-06-12 NOTE — Progress Notes (Signed)
Speech Language Pathology Daily Session Note  Patient Details  Name: Frederick ForesterSteven Quam Jr. MRN: [id number] Date of Birth: 01-Jan-1986  Today's Date: 06/12/2018 SLP Individual Time: 1015-1045 SLP Individual Time Calculation (min): 30 min  Short Term Goals: Week 2: SLP Short Term Goal 1 (Week 2): STG=LTGs  Skilled Therapeutic Interventions:  Pt was seen for skilled ST targeting cognitive goals.  SLP facilitated the session with a previously taught card game to address problem solving and recall goals.  Pt could recall the rules and procedures of card game for 100% accuracy and problem solve through task with mod I.  Pt had slowed processing which lead to him finishing task after therapist on both attempts although he needed no cuing to plan and execute a problem solving strategy.  Pt was left in recliner with chair alarm set and call bell within reach.  Continue per current plan of care.   Function:  Eating Eating                 Cognition Comprehension Comprehension assist level: Follows basic conversation/direction with no assist  Expression   Expression assist level: Expresses basic needs/ideas: With no assist  Social Interaction Social Interaction assist level: Interacts appropriately 90% of the time - Needs monitoring or encouragement for participation or interaction.  Problem Solving Problem solving assist level: Solves basic 75 - 89% of the time/requires cueing 10 - 24% of the time  Memory Memory assist level: Recognizes or recalls 75 - 89% of the time/requires cueing 10 - 24% of the time    Pain Pain Assessment Pain Scale: 0-10 Pain Score: 0-No pain  Therapy/Group: Individual Therapy  Shaquan Puerta, Melanee SpryNicole L 06/12/2018, 11:42 AM

## 2018-06-12 NOTE — Progress Notes (Signed)
ANTICOAGULATION CONSULT NOTE - Follow Up Consult  Pharmacy Consult for Lovenox Indication: DVT  Allergies  Allergen Reactions  . Lactose Intolerance (Gi) Diarrhea and Nausea And Vomiting    Patient Measurements: Height: 5\' 6"  (167.6 cm) Weight: 145 lb 4.5 oz (65.9 kg) IBW/kg (Calculated) : 63.8  Vital Signs: Temp: 98.9 F (37.2 C) (07/12 0529) Temp Source: Oral (07/12 0529) BP: 118/74 (07/12 0529) Pulse Rate: 89 (07/12 0529)  Labs: No results for input(s): HGB, HCT, PLT, APTT, LABPROT, INR, HEPARINUNFRC, HEPRLOWMOCWT, CREATININE, CKTOTAL, CKMB, TROPONINI in the last 72 hours.  Estimated Creatinine Clearance: 119.6 mL/min (A) (by C-G formula based on SCr of 0.59 mg/dL (L)).   Assessment:  Anticoag: None PTA - enox for distal DVT since 6/18. Hgb 11.9, pltc 430K, stable Repeat dopplers negative for DVT. To d/c enoxaparin once completely mobile per MD note  Goal of Therapy:  Anti-Xa level 0.6-1 units/ml 4hrs after LMWH dose given Monitor platelets by anticoagulation protocol: Yes   Plan:  Lovenox 75mg  [place] q12h qTues BMET/CBC   Janise Gora S. Merilynn Finlandobertson, PharmD, BCPS Clinical Staff Pharmacist Pager [phone]  Misty Stanleyobertson, Sula Fetterly Stillinger 06/12/2018,8:42 AM

## 2018-06-12 NOTE — Progress Notes (Signed)
Speech Language Pathology Daily Session Note  Patient Details  Name: Frederick ForesterSteven Matthews Jr. MRN: [id number] Date of Birth: [birth year]  Today's Date: 06/12/2018 SLP Individual Time: [id number] SLP Individual Time Calculation (min): 60 min  Short Term Goals: Week 2: SLP Short Term Goal 1 (Week 2): STG=LTGs  Skilled Therapeutic Interventions: Skilled treatment session focused on dysphagia and cognitive goals. SLP facilitated session by providing skilled observation with trial tray of regular textures. Patient completed meal without overt s/s of aspiration and was Mod I for use of swallowing compensatory strategies, therefore, recommend patient upgrade to regular textures. SLP also facilitated session with recall of his current medications and their function in which patient did with 100% accuracy and Mod I and was also Mod I in regards to how to appropriately manage medications at discharge. Patient left upright in recliner with alarm on and all needs within reach. Continue with current plan of care.      Function:  Eating Eating   Modified Consistency Diet: Yes Eating Assist Level: Swallowing techniques: self managed           Cognition Comprehension Comprehension assist level: Follows basic conversation/direction with no assist  Expression   Expression assist level: Expresses basic needs/ideas: With no assist  Social Interaction Social Interaction assist level: Interacts appropriately 90% of the time - Needs monitoring or encouragement for participation or interaction.  Problem Solving Problem solving assist level: Solves complex problems: With extra time  Memory Memory assist level: More than reasonable amount of time    Pain Pain Assessment Pain Scale: 0-10 Pain Score: 0-No pain  Therapy/Group: Individual Therapy  Anesha Hackert 06/12/2018, 1:17 PM

## 2018-06-12 NOTE — Progress Notes (Signed)
Frederick Molina PHYSICAL MEDICINE & REHABILITATION     PROGRESS NOTE  Subjective/Complaints:  No new complaints. Pleased with progress. Denies pain.   ROS: Patient denies fever, rash, sore throat, blurred vision, nausea, vomiting, diarrhea, cough, shortness of breath or chest pain, joint or back pain, headache, or mood change.     Objective: Vital Signs: Blood pressure 118/74, pulse 89, temperature 98.9 F (37.2 C), temperature source Oral, resp. rate 16, height 5\' 6"  (1.676 m), weight 65.9 kg (145 lb 4.5 oz), SpO2 97 %. No results found. No results for input(s): WBC, HGB, HCT, PLT in the last 72 hours. No results for input(s): NA, K, CL, GLUCOSE, BUN, CREATININE, CALCIUM in the last 72 hours.  Invalid input(s): CO CBG (last 3)  No results for input(s): GLUCAP in the last 72 hours.  Wt Readings from Last 3 Encounters:  06/12/18 65.9 kg (145 lb 4.5 oz)  06/04/18 70.9 kg (156 lb 4.9 oz)  01/08/17 66.4 kg (146 lb 5 oz)    Physical Exam:  BP 118/74 (BP Location: Right Arm)   Pulse 89   Temp 98.9 F (37.2 C) (Oral)   Resp 16   Ht 5\' 6"  (1.676 m)   Wt 65.9 kg (145 lb 4.5 oz)   SpO2 97%   BMI 23.45 kg/m  Constitutional: No distress . Vital signs reviewed. HEENT: EOMI, oral membranes moist, cranial defect Neck: supple Cardiovascular: RRR without murmur. No JVD    Respiratory: CTA Bilaterally without wheezes or rales. Normal effort    GI: BS +, non-tender, non-distended  Musculoskeletal: No edema or tenderness in extremities  Neurological: He is alert.  Alert and Oriented x3 Normal language. Improving insight and awareness. Improved processing.  Motor: RUE/RLE: 5/5 proximal to distal LUE: 4 +/5 proximal to distal, negative pronator drift LLE: 4+-5/5 proximal to distal  Skin: scalp CDI Psychiatric: pleasant and polite.   Assessment/Plan: 1. Functional deficits secondary to polytrauma with TBI which require 3+ hours per day of interdisciplinary therapy in a comprehensive  inpatient rehab setting. Physiatrist is providing close team supervision and 24 hour management of active medical problems listed below. Physiatrist and rehab team continue to assess barriers to discharge/monitor patient progress toward functional and medical goals.  Function:  Bathing Bathing position   Position: Shower  Bathing parts Body parts bathed by patient: Right arm, Left arm, Chest, Abdomen, Front perineal area, Buttocks, Right upper leg, Left upper leg, Right lower leg, Left lower leg, Back Body parts bathed by helper: Back  Bathing assist Assist Level: Supervision or verbal cues      Upper Body Dressing/Undressing Upper body dressing   What is the patient wearing?: Pull over shirt/dress     Pull over shirt/dress - Perfomed by patient: Thread/unthread right sleeve, Thread/unthread left sleeve, Put head through opening, Pull shirt over trunk          Upper body assist Assist Level: Supervision or verbal cues   Set up : To obtain clothing/put away  Lower Body Dressing/Undressing Lower body dressing   What is the patient wearing?: Underwear, Pants, Shoes, Socks Underwear - Performed by patient: Thread/unthread right underwear leg, Thread/unthread left underwear leg, Pull underwear up/down   Pants- Performed by patient: Thread/unthread right pants leg, Thread/unthread left pants leg, Pull pants up/down   Non-skid slipper socks- Performed by patient: Don/doff right sock, Don/doff left sock   Socks - Performed by patient: Don/doff right sock, Don/doff left sock   Shoes - Performed by patient: Don/doff right shoe, Don/doff  left shoe, Fasten right, Fasten left            Lower body assist Assist for lower body dressing: Supervision or verbal cues      Toileting Toileting   Toileting steps completed by patient: Adjust clothing prior to toileting, Performs perineal hygiene, Adjust clothing after toileting Toileting steps completed by helper: Adjust clothing prior to  toileting, Performs perineal hygiene, Adjust clothing after toileting(per Carla DrapeLeann Nickles-Wright, NT) Toileting Assistive Devices: Grab bar or rail  Toileting assist Assist level: Supervision or verbal cues   Transfers Chair/bed transfer   Chair/bed transfer method: Ambulatory Chair/bed transfer assist level: Supervision or verbal cues Chair/bed transfer assistive device: Armrests     Locomotion Ambulation     Max distance: 150' Assist level: Supervision or verbal cues   Wheelchair   Type: Manual Max wheelchair distance: 150' Assist Level: Supervision or verbal cues  Cognition Comprehension Comprehension assist level: Follows basic conversation/direction with no assist  Expression Expression assist level: Expresses basic needs/ideas: With no assist  Social Interaction Social Interaction assist level: Interacts appropriately 75 - 89% of the time - Needs redirection for appropriate language or to initiate interaction.  Problem Solving Problem solving assist level: Solves basic problems with no assist  Memory Memory assist level: Recognizes or recalls 75 - 89% of the time/requires cueing 10 - 24% of the time    Medical Problem List and Plan: 1.  Deficits with mobility, endurance, strength, cognition, self-care secondary to polytrauma with TBI.  -continue therapies  -bump dc date up to Tuesday 2. Acute bilateral gastrocnemius DVT (05/14/18)/Anticoagulation:   Repeat vascular Dopplers negative for DVT 7/4  -ambulatory. Off lovenox now  3. Pain Management: Wean oxycodone as tolerated.  4. Mood: LCSW to follow for evaluation and support.  5. Neuropsych: This patient is not fully capable of making decisions on his own behalf. 6. Skin/Wound Care: Continue foam dressings to back where skin has sloughed off. Routine pressure relief measures.  7. Fluids/Electrolytes/Nutrition: Monitor I/O as on dysphagia 2, nectars.    Advance diet as tolerated 8. Pseudomonas UTI/Staph bacteremia with  leucocytosis: Has completed 7 day course of Fortaz and Vancomycin on 7/3.  9. Delirium:  Precedex weaned off and agitation improving with Seroquel at bedtime as well as Klonopin bid  Decreased seroquel to 100mg  qhs on 7/9---no problems---decrease to 50mg  t  7/11  -dc klonopin today 10. Acute respiratory failure: Resolved.  11. Bilateral rib fractures/large R-PTX/Pulmonary contusion: Encourage IS  12. ABLA: Stable overall. Continue to monitor with serial checks.   Hemoglobin 11.9 7/9  Continue to monitor 13. Liver laceration: Abnormal LFTs due to liver trauma. Continue to monitor.   improving 14. HTN with tachycardia:   Likely reactive, monitor with increased mobility 15. Hypokalemia  Potassium 4.0   7/9  LOS (Days) 8 A FACE TO FACE EVALUATION WAS PERFORMED  Frederick Molina 06/12/2018 9:10 AM

## 2018-06-12 NOTE — Progress Notes (Signed)
Physical Therapy Weekly Progress Note  Patient Details  Name: Frederick Molina. MRN: [id number] Date of Birth: [birth year]  Beginning of progress report period: June 05, 2018 End of progress report period: June 12, 2018  Today's Date: 06/12/2018 PT Individual Time: 1045-1140 PT Individual Time Calculation (min): 55 min   Pt performs gait throughout unit and hospital without AD with close supervision indoors including elevator negotiation and automatic doors.  Pt performs gait outdoors initially with min A, progressing to close supervision with cues for safety and attention due to uneven surfaces.  Pt able to cross street with increased time and cues for safety.  Pt performs stairs outdoors with 1 handrail x 8 stairs and 3 stairs with min guard.  Discussed energy conservation for home and pt verbalizes understanding. Standing balance with basketball shooting with supervision for standing balance.  PROM and soft tissue mobilization to bilat shoulders to improve ROM, pt reports improved ease of movement after treatment. Pt left in room with safety belt on, needs at hand.  Patient has met 2 of 2 short term goals.  Pt progressing well towards long term goals of supervision for gait and mobility.  Improved transfers and activity tolerance.  Patient continues to demonstrate the following deficits muscle weakness and decreased standing balance and decreased balance strategies and therefore will continue to benefit from skilled PT intervention to increase functional independence with mobility.  Patient progressing toward long term goals..  Continue plan of care.  PT Short Term Goals Week 1:  PT Short Term Goal 1 (Week 1): Pt will ambulate x 100 ft with min A and LRAD consistently PT Short Term Goal 1 - Progress (Week 1): Met PT Short Term Goal 2 (Week 1): Pt will ascend/descend 4 6" stairs with 2 handrails min A PT Short Term Goal 2 - Progress (Week 1): Met PT Short Term Goal 3 (Week 1): Pt will  propel w/c x 150 ft Mod I PT Short Term Goal 3 - Progress (Week 1): Discontinued (comment) Week 2:  PT Short Term Goal 1 (Week 2): = LTG  Skilled Therapeutic Interventions/Progress Updates:  Ambulation/gait training;Balance/vestibular training;Cognitive remediation/compensation;Community reintegration;Discharge planning;Disease management/prevention;DME/adaptive equipment instruction;Functional mobility training;Neuromuscular re-education;Pain management;Patient/family education;Psychosocial support;Stair training;Therapeutic Activities;Therapeutic Exercise;UE/LE Strength taining/ROM;UE/LE Coordination activities;Wheelchair propulsion/positioning   Therapy Documentation Precautions:  Precautions Precautions: Fall Precaution Comments: skull flap out on R Restrictions Weight Bearing Restrictions: No Pain: No c/o pain  See Function Navigator for Current Functional Status.  Therapy/Group: Individual Therapy  Idell Hissong 06/12/2018, 11:38 AM

## 2018-06-12 NOTE — Progress Notes (Signed)
Occupational Therapy Session Note  Patient Details  Name: Frederick ForesterSteven Betley Jr. MRN: [id number] Date of Birth: 08-24-1986  Today's Date: 06/12/2018 OT Individual Time: [id number] OT Individual Time Calculation (min): 70 min    Short Term Goals: Week 2:     Skilled Therapeutic Interventions/Progress Updates:    1:1. Pt requesting to begin session with shower. Pt reporting tightness in low back but no pain. Pt ambulates throughout session with supervision and no LOB with VC for looking ahead and arm swing. Pt gathers clothes and bathes/dresses at sit to stand level from TTB in shower with supervision. Pt brushes teeth standing at sink with supervision. Pt educated further on other yoga stretches for hip/low back tightness, reclined half pigeon, pyramid and triangle. Pt reports back feeling better after stretching. Pt completes standing balance activity of wii fit soccer game for improved weight shifting laterally for reaction timing and balance during community mobility. Exited session with pt seated in recliner, qrb donned and call light in reach  Therapy Documentation Precautions:  Precautions Precautions: Fall Precaution Comments: skull flap out on R Restrictions Weight Bearing Restrictions: No General:   Vital Signs: Therapy Vitals Temp: 98.9 F (37.2 C) Temp Source: Oral Pulse Rate: 89 Resp: 16 BP: 118/74 Patient Position (if appropriate): Lying Oxygen Therapy SpO2: 97 % O2 Device: Room Air Pain: Pain Assessment Pain Scale: 0-10 ADL: ADL ADL Comments: refer to functional navigator  See Function Navigator for Current Functional Status.   Therapy/Group: Individual Therapy  Shon HaleStephanie M Jenavi Beedle 06/12/2018, 9:26 AM

## 2018-06-13 ENCOUNTER — Inpatient Hospital Stay (HOSPITAL_COMMUNITY): Payer: 59 | Admitting: Occupational Therapy

## 2018-06-13 ENCOUNTER — Inpatient Hospital Stay (HOSPITAL_COMMUNITY): Payer: 59 | Admitting: Physical Therapy

## 2018-06-13 MED ORDER — QUETIAPINE FUMARATE 50 MG PO TABS: 50.0000 mg | ORAL_TABLET | Freq: Every evening | ORAL | Status: DC | PRN

## 2018-06-13 NOTE — Progress Notes (Signed)
[place] PHYSICAL MEDICINE & REHABILITATION     PROGRESS NOTE  Subjective/Complaints:  Up in bed. Slept well. In good spirits  ROS: Patient denies fever, rash, sore throat, blurred vision, nausea, vomiting, diarrhea, cough, shortness of breath or chest pain, joint or back pain, headache, or mood change.    Objective: Vital Signs: Blood pressure 115/70, pulse 85, temperature 99.2 F (37.3 C), temperature source Oral, resp. rate 18, height 5\' 6"  (1.676 m), weight 67.2 kg (148 lb 2.4 oz), SpO2 96 %. No results found. No results for input(s): WBC, HGB, HCT, PLT in the last 72 hours. No results for input(s): NA, K, CL, GLUCOSE, BUN, CREATININE, CALCIUM in the last 72 hours.  Invalid input(s): CO CBG (last 3)  No results for input(s): GLUCAP in the last 72 hours.  Wt Readings from Last 3 Encounters:  06/13/18 67.2 kg (148 lb 2.4 oz)  06/04/18 70.9 kg (156 lb 4.9 oz)  01/08/17 66.4 kg (146 lb 5 oz)    Physical Exam:  BP 115/70 (BP Location: Left Arm)   Pulse 85   Temp 99.2 F (37.3 C) (Oral)   Resp 18   Ht 5\' 6"  (1.676 m)   Wt 67.2 kg (148 lb 2.4 oz)   SpO2 96%   BMI 23.91 kg/m  Constitutional: No distress . Vital signs reviewed. HEENT: EOMI, oral membranes moist Neck: supple Cardiovascular: RRR without murmur. No JVD    Respiratory: CTA Bilaterally without wheezes or rales. Normal effort    GI: BS +, non-tender, non-distended  Musculoskeletal: No edema or tenderness in extremities  Neurological: He is alert.  Alert and Oriented x3 Normal language. Improving insight and awareness. Improved processing.  Motor: RUE/RLE: 5/5 proximal to distal LUE: 4 +/5 proximal to distal, negative pronator drift LLE: 4+-5/5 proximal to distal  Skin: scalp CDI Psychiatric: pleasant and polite.   Assessment/Plan: 1. 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 Physiatrist is  providing close team supervision and 24 hour management of active medical problems listed below. Physiatrist and rehab team continue to assess barriers to discharge/monitor patient progress toward functional and medical goals.  Function:  Bathing Bathing position   Position: Shower  Bathing parts Body parts bathed by patient: Right arm, Left arm, Chest, Abdomen, Front perineal area, Buttocks, Right upper leg, Left upper leg, Right lower leg, Left lower leg, Back Body parts bathed by helper: Back  Bathing assist Assist Level: Supervision or verbal cues      Upper Body Dressing/Undressing Upper body dressing   What is the patient wearing?: Pull over shirt/dress     Pull over shirt/dress - Perfomed by patient: Thread/unthread right sleeve, Thread/unthread left sleeve, Put head through opening, Pull shirt over trunk          Upper body assist Assist Level: Supervision or verbal cues   Set up : To obtain clothing/put away  Lower Body Dressing/Undressing Lower body dressing   What is the patient wearing?: Underwear, Pants, Shoes, Socks Underwear - Performed by patient: Thread/unthread right underwear leg, Thread/unthread left underwear leg, Pull underwear up/down   Pants- Performed by patient: Thread/unthread right pants leg, Thread/unthread left pants leg, Pull pants up/down   Non-skid slipper socks- Performed by patient: Don/doff right sock, Don/doff left sock   Socks - Performed by patient: Don/doff right sock, Don/doff left sock   Shoes - Performed by patient: Don/doff right shoe, Don/doff left shoe, Fasten right,  Fasten left            Lower body assist Assist for lower body dressing: Supervision or verbal cues      Toileting Toileting   Toileting steps completed by patient: Adjust clothing prior to toileting, Performs perineal hygiene, Adjust clothing after toileting Toileting steps completed by helper: Adjust clothing prior to toileting, Performs perineal hygiene,  Adjust clothing after toileting(per Carla DrapeLeann Nickles-Wright, NT) Toileting Assistive Devices: Grab bar or rail  Toileting assist Assist level: Supervision or verbal cues   Transfers Chair/bed transfer   Chair/bed transfer method: Ambulatory Chair/bed transfer assist level: Supervision or verbal cues Chair/bed transfer assistive device: Armrests     Locomotion Ambulation     Max distance: 150' Assist level: Supervision or verbal cues   Wheelchair   Type: Manual Max wheelchair distance: 150' Assist Level: Supervision or verbal cues  Cognition Comprehension Comprehension assist level: Follows basic conversation/direction with no assist  Expression Expression assist level: Expresses basic needs/ideas: With no assist  Social Interaction Social Interaction assist level: Interacts appropriately 90% of the time - Needs monitoring or encouragement for participation or interaction.  Problem Solving Problem solving assist level: Solves complex problems: With extra time  Memory Memory assist level: More than reasonable amount of time    Medical Problem List and Plan: 1.  Deficits with mobility, endurance, strength, cognition, self-care secondary to polytrauma with TBI.  -continue therapies  -ELOS 7/16 2. Acute bilateral gastrocnemius DVT (05/14/18)/Anticoagulation:   Repeat vascular Dopplers negative for DVT 7/4  -ambulatory. Off lovenox now  3. Pain Management: Wean oxycodone as tolerated.  4. Mood: LCSW to follow for evaluation and support.  5. Neuropsych: This patient is not fully capable of making decisions on his own behalf. 6. Skin/Wound Care: Continue foam dressings to back where skin has sloughed off. Routine pressure relief measures.  7. Fluids/Electrolytes/Nutrition: Monitor I/O as on dysphagia 2, nectars.    Advance diet as tolerated 8. Pseudomonas UTI/Staph bacteremia with leucocytosis: Has completed 7 day course of Fortaz and Vancomycin on 7/3.  9. Delirium:  Precedex weaned  off and agitation improving with Seroquel at bedtime as well as Klonopin bid  Change seroquel to prn  -dc'ed klonopin  10. Acute respiratory failure: Resolved.  11. Bilateral rib fractures/large R-PTX/Pulmonary contusion: Encourage IS  12. ABLA: Stable overall. Continue to monitor with serial checks.   Hemoglobin 11.9 7/9  Continue to monitor 13. Liver laceration: Abnormal LFTs due to liver trauma. Continue to monitor.   improving 14. HTN with tachycardia:   Likely reactive, monitor with increased mobility 15. Hypokalemia  Potassium 4.0   7/9  LOS (Days) 9 A FACE TO FACE EVALUATION WAS PERFORMED  Ranelle OysterZachary T Anthonia Monger 06/13/2018 8:38 AM

## 2018-06-13 NOTE — Progress Notes (Signed)
Physical Therapy Session Note  Patient Details  Name: Frederick Molina. MRN: [id number] Date of Birth: June 09, 1986  Today's Date: 06/13/2018 PT Individual Time: 0900-0945 PT Individual Time Calculation (min): 45 min   Short Term Goals: Week 1:  PT Short Term Goal 1 (Week 1): Pt will ambulate x 100 ft with min A and LRAD consistently PT Short Term Goal 1 - Progress (Week 1): Met PT Short Term Goal 2 (Week 1): Pt will ascend/descend 4 6" stairs with 2 handrails min A PT Short Term Goal 2 - Progress (Week 1): Met PT Short Term Goal 3 (Week 1): Pt will propel w/c x 150 ft Mod I PT Short Term Goal 3 - Progress (Week 1): Discontinued (comment) Week 2:  PT Short Term Goal 1 (Week 2): = LTG  Skilled Therapeutic Interventions/Progress Updates:   Pt received sitting in reclliner and agreeable to PT. PT instructed pt in gait training in simulated community environment to parking deck and return to main entrance from [facility] 1000+ft x 2 distant supervision assist from PT for safety, no LOB noted by PT. Path finding and anticipatory problem sloving activity to return to 4W from main entrance utilizing signs in the hospital for navigaiton. Moderate cues for attention to signage as well as problem solving strategies to integrate maps and location in hospital to develop plan to return target destination. Patient returned to room and left sitting in [facility] with call bell in reach and all needs met.           Therapy Documentation Precautions:  Precautions Precautions: Fall Precaution Comments: skull flap out on R Restrictions Weight Bearing Restrictions: No Pain: denies  See Function Navigator for Current Functional Status.   Therapy/Group: Individual Therapy  Lorie Phenix 06/13/2018, 1:10 PM

## 2018-06-13 NOTE — Progress Notes (Signed)
Occupational Therapy Session Note  Patient Details  Name: Frederick ForesterSteven Shvartsman Jr. MRN: [id number] Date of Birth: 1986-08-26  Today's Date: 06/13/2018 OT Individual Time: [id number] OT Individual Time Calculation (min): 45 min   Skilled Therapeutic Interventions/Progress Updates: Patient participated in skilled OT as follows:  Walked room to shower via tub transfer bench and with an Assistive Device with close supervision (slightly kphotic posture),   Bathing and dressing = supervision without losses of balance through dynamically standing and moving about.  His fiance was present for the session.   He was left seated in his recliner with call phone nearby and the new style alarmed quick release belt applied     Therapy Documentation Precautions:  Precautions Precautions: Fall Precaution Comments: skull flap out on R Restrictions Weight Bearing Restrictions: No  Pain:denied    Therapy/Group: Individual Therapy  Bud Faceickett, Elimelech Houseman [facility] 06/13/2018, 3:49 PM

## 2018-06-14 ENCOUNTER — Inpatient Hospital Stay (HOSPITAL_COMMUNITY): Payer: 59 | Admitting: Occupational Therapy

## 2018-06-14 ENCOUNTER — Inpatient Hospital Stay (HOSPITAL_COMMUNITY): Payer: 59

## 2018-06-14 NOTE — Progress Notes (Addendum)
Physical Therapy Session Note  Patient Details  Name: Frederick Molina. MRN: [id number] Date of Birth: 1986-06-20  Today's Date: 06/14/2018 PT Individual Time: 6629-4765 PT Individual Time Calculation (min): 43 min   Short Term Goals: Week 1:  PT Short Term Goal 1 (Week 1): Pt will ambulate x 100 ft with min A and LRAD consistently PT Short Term Goal 1 - Progress (Week 1): Met PT Short Term Goal 2 (Week 1): Pt will ascend/descend 4 6" stairs with 2 handrails min A PT Short Term Goal 2 - Progress (Week 1): Met PT Short Term Goal 3 (Week 1): Pt will propel w/c x 150 ft Mod I PT Short Term Goal 3 - Progress (Week 1): Discontinued (comment) Week 2:  PT Short Term Goal 1 (Week 2): = LTG  Skilled Therapeutic Interventions/Progress Updates:   Gait throughout unit without AD, with supervision. Pt reported his back felt very stiff.  neuromuscular re-education via forced use for alternating reciprocal movement (and back rotation) x 4 extretmies at level 5 x 7 minutes on NuStep, rated 13 on BORG.  Supine 2 x 10 bil bridging, bil lower trunk rotation.  Self stretching R/L with static positioning with bil knees to R/L side with opposite cervical rotation, x 2 minutes each side.  Therapeutic activity in unsupported sitting to facilitate trunk shortening/lengthening/rotating while reaching R/L out of BOS x 9 each, to match cards to board in front of him.  Pt squinted while doing this, but stated his vision was fine.  He was accurate with 17/18, but was unable to problem-solve what had happened on incorrect match  Floor transfer modified independent.  On floor mat, 1/2 kneeling biased to R/L x 1 minute each with increased difficulty when wt bearing biased to L.  No UE support.  Gait to return to room, kicking Yoga block with R foot repeatedly, to bias SLS to L.  No LOB but pt reluctant to exert power initially, improving with practice.    Pt left resting in armchair with seat alarm set, table in front  of him, and fiancee and mother in room.   Therapy Documentation Precautions:  Precautions Precautions: Fall Precaution Comments: skull flap out on R Restrictions Weight Bearing Restrictions: No General:   Vital Signs:   Pain: not rated, but stated back and leg muscles are tight and somewhat sore; addressed with activity        See Function Navigator for Current Functional Status.   Therapy/Group: Individual Therapy  Amariyana Heacox 06/14/2018, 5:05 PM

## 2018-06-14 NOTE — Progress Notes (Signed)
Occupational Therapy Session Note  Patient Details  Name: Frederick ForesterSteven Tarte Jr. MRN: [id number] Date of Birth: 03-19-1986  Today's Date: 06/14/2018 OT Individual Time: 1050-1130 OT Individual Time Calculation (min): 40 min   Skilled Therapeutic Interventions/Progress Updates:    Pt greeted in bed with no c/o pain. Motivated for therapy. Tx focus on functional transfers, balance, activity tolerance, and cognitive remediation during BADL routine. He ambulated throughout session without device and supervision assist. Pt gathered necessary items for shower and then bathed sit<stand from TTB. Min cues required for sequencing and rinsing Rt side of body. With RN consent, back bandages were removed and skin gently washed. Dressings reapplied afterwards. He then dressed sit<stand from TTB with setup and extra time. Discussed grad day events/expectations with pt and fiance Frederick Molina. They both verbalized feeling proud and grateful regarding pts CIR journey. After pt completed oral care while standing at sink, he transferred to recliner. Pt left with all needs and safety belt fastened at session exit.   With MD clearance, shower cap was not worn during shower and pt gently rinsed head with supervision  Therapy Documentation Precautions:  Precautions Precautions: Fall Precaution Comments: skull flap out on R Restrictions Weight Bearing Restrictions: No ADL: ADL ADL Comments: refer to functional navigator    See Function Navigator for Current Functional Status.   Therapy/Group: Individual Therapy  Ellenore Roscoe A Ares Tegtmeyer 06/14/2018, 12:46 PM

## 2018-06-14 NOTE — Progress Notes (Signed)
[place] PHYSICAL MEDICINE & REHABILITATION     PROGRESS NOTE  Subjective/Complaints:  Slept very well without any medication last night.  Fianc in the room  ROS: Patient denies fever, rash, sore throat, blurred vision, nausea, vomiting, diarrhea, cough, shortness of breath or chest pain, joint or back pain, headache, or mood change. .    Objective: Vital Signs: Blood pressure 128/80, pulse 84, temperature 97.9 F (36.6 C), temperature source Oral, resp. rate 20, height 5\' 6"  (1.676 m), weight 68 kg (149 lb 14.6 oz), SpO2 98 %. No results found. No results for input(s): WBC, HGB, HCT, PLT in the last 72 hours. No results for input(s): NA, K, CL, GLUCOSE, BUN, CREATININE, CALCIUM in the last 72 hours.  Invalid input(s): CO CBG (last 3)  No results for input(s): GLUCAP in the last 72 hours.  Wt Readings from Last 3 Encounters:  06/14/18 68 kg (149 lb 14.6 oz)  06/04/18 70.9 kg (156 lb 4.9 oz)  01/08/17 66.4 kg (146 lb 5 oz)    Physical Exam:  BP 128/80 (BP Location: Left Arm)   Pulse 84   Temp 97.9 F (36.6 C) (Oral)   Resp 20   Ht 5\' 6"  (1.676 m)   Wt 68 kg (149 lb 14.6 oz)   SpO2 98%   BMI 24.20 kg/m  Constitutional: No distress . Vital signs reviewed. HEENT: EOMI, oral membranes moist Neck: supple Cardiovascular: RRR without murmur. No JVD    Respiratory: CTA Bilaterally without wheezes or rales. Normal effort    GI: BS +, non-tender, non-distended  Musculoskeletal: No edema or tenderness in extremities  Neurological: He is alert.  Alert and Oriented x3 Normal language. Improving insight and awareness. Improved processing.  Motor: RUE/RLE: 5/5 proximal to distal LUE: 4 +/5 proximal to distal, negative pronator drift LLE: 4+-5/5 proximal to distal  Skin: scalp CDI Psychiatric: pleasant and polite.   Assessment/Plan: 1. Functional deficits secondary to polytrauma with TBI which require 3+ hours per day of interdisciplinary therapy in a comprehensive inpatient  rehab setting. Physiatrist is providing close team supervision and 24 hour management of active medical problems listed below. Physiatrist and rehab team continue to assess barriers to discharge/monitor patient progress toward functional and medical goals.  Function:  Bathing Bathing position   Position: Shower  Bathing parts Body parts bathed by patient: Right arm, Left arm, Chest, Abdomen, Front perineal area, Buttocks, Right upper leg, Left upper leg, Right lower leg, Left lower leg, Back Body parts bathed by helper: Back  Bathing assist Assist Level: Supervision or verbal cues      Upper Body Dressing/Undressing Upper body dressing   What is the patient wearing?: Pull over shirt/dress     Pull over shirt/dress - Perfomed by patient: Thread/unthread right sleeve, Thread/unthread left sleeve, Put head through opening, Pull shirt over trunk          Upper body assist Assist Level: Supervision or verbal cues   Set up : To obtain clothing/put away  Lower Body Dressing/Undressing Lower body dressing   What is the patient wearing?: Underwear, Pants, Shoes, Socks Underwear - Performed by patient: Thread/unthread right underwear leg, Thread/unthread left underwear leg, Pull underwear up/down   Pants- Performed by patient: Thread/unthread right pants leg, Thread/unthread left pants leg, Pull pants up/down   Non-skid slipper socks- Performed by patient: Don/doff right sock, Don/doff left sock   Socks - Performed by patient: Don/doff right sock, Don/doff left sock   Shoes - Performed by patient: Don/doff right  shoe, Don/doff left shoe, Fasten right, Fasten left            Lower body assist Assist for lower body dressing: Supervision or verbal cues      Toileting Toileting   Toileting steps completed by patient: Performs perineal hygiene, Adjust clothing after toileting, Adjust clothing prior to toileting Toileting steps completed by helper: Adjust clothing prior to  toileting, Performs perineal hygiene, Adjust clothing after toileting(per Carla DrapeLeann Nickles-Wright, NT) Toileting Assistive Devices: Grab bar or rail  Toileting assist Assist level: Supervision or verbal cues   Transfers Chair/bed transfer   Chair/bed transfer method: Ambulatory Chair/bed transfer assist level: No Help, no cues, assistive device, takes more than a reasonable amount of time Chair/bed transfer assistive device: Armrests     Locomotion Ambulation     Max distance: 1000 Assist level: Supervision or verbal cues   Wheelchair   Type: Manual Max wheelchair distance: 150' Assist Level: Supervision or verbal cues  Cognition Comprehension Comprehension assist level: Follows basic conversation/direction with no assist  Expression Expression assist level: Expresses basic needs/ideas: With no assist  Social Interaction Social Interaction assist level: Interacts appropriately 90% of the time - Needs monitoring or encouragement for participation or interaction.  Problem Solving Problem solving assist level: Solves basic problems with no assist  Memory Memory assist level: Recognizes or recalls 75 - 89% of the time/requires cueing 10 - 24% of the time    Medical Problem List and Plan: 1.  Deficits with mobility, endurance, strength, cognition, self-care secondary to polytrauma with TBI.  -continue therapies  -ELOS 7/16 2. Acute bilateral gastrocnemius DVT (05/14/18)/Anticoagulation:   Repeat vascular Dopplers negative for DVT 7/4  -ambulatory. Off lovenox now  3. Pain Management: Wean oxycodone as tolerated.  4. Mood: LCSW to follow for evaluation and support.  5. Neuropsych: This patient is not fully capable of making decisions on his own behalf. 6. Skin/Wound Care: Continue foam dressings to back where skin has sloughed off. Routine pressure relief measures.  7. Fluids/Electrolytes/Nutrition: Monitor I/O as on dysphagia 2, nectars.    Advance diet as tolerated 8. Pseudomonas  UTI/Staph bacteremia with leucocytosis: Has completed 7 day course of Fortaz and Vancomycin on 7/3.  9. Delirium:   Trazodone for sleep as needed only 10. Acute respiratory failure: Resolved.  11. Bilateral rib fractures/large R-PTX/Pulmonary contusion: Encourage IS  12. ABLA: Stable overall. Continue to monitor with serial checks.   Hemoglobin 11.9 7/9  Continue to monitor 13. Liver laceration: Abnormal LFTs due to liver trauma. Continue to monitor.   improving 14. HTN with tachycardia:   Likely reactive, monitor with increased mobility 15. Hypokalemia  Potassium 4.0   7/9  LOS (Days) 10 A FACE TO FACE EVALUATION WAS PERFORMED  Ranelle OysterZachary T Kemyra August 06/14/2018 8:24 AM

## 2018-06-14 NOTE — Progress Notes (Signed)
Occupational Therapy Session Note  Patient Details  Name: Frederick ForesterSteven Reisen Jr. MRN: [id number] Date of Birth: March 17, 1986  Today's Date: 06/14/2018 OT Missed Time: 60 Minutes Missed Time Reason: Patient fatigue  Skilled Therapeutic Interventions/Progress Updates:    Pt seen for scheduled group therapy. He reported feeling too fatigued from AM therapies to engage in tx. 60 minutes missed.   Therapy Documentation Precautions:  Precautions Precautions: Fall Precaution Comments: skull flap out on R Restrictions Weight Bearing Restrictions: No General: General OT Amount of Missed Time: 60 Minutes Vital Signs: Therapy Vitals Pulse Rate: 79 Resp: 20 BP: 122/77 Patient Position (if appropriate): Sitting Oxygen Therapy SpO2: 100 % O2 Device: Room Air ADL: ADL ADL Comments: refer to functional navigator    See Function Navigator for Current Functional Status.   Therapy/Group: Individual Therapy  Alexandr Yaworski A Kaytee Taliercio 06/14/2018, 4:43 PM

## 2018-06-15 ENCOUNTER — Inpatient Hospital Stay (HOSPITAL_COMMUNITY): Payer: Self-pay | Admitting: Speech Pathology

## 2018-06-15 ENCOUNTER — Inpatient Hospital Stay (HOSPITAL_COMMUNITY): Payer: 59 | Admitting: Occupational Therapy

## 2018-06-15 ENCOUNTER — Ambulatory Visit (HOSPITAL_COMMUNITY): Payer: 59 | Admitting: Physical Therapy

## 2018-06-15 NOTE — Progress Notes (Signed)
Resting w/o distress or discomfort, arouses easily, mother at bedside, denies pain, Cont. Medical regime, call bell within reach, monitor

## 2018-06-15 NOTE — Progress Notes (Signed)
Occupational Therapy Discharge Summary  Patient Details  Name: Frederick Molina. MRN: [id number] Date of Birth: Nov 08, 1986  Today's Date: 06/15/2018 OT Individual Time: 1330-1500 OT Individual Time Calculation (min): 90 min   OT treatment session focused on activity tolerance, functional ambulation, dc planning, UB strengthening, and stretching. Pt ambulated to 4N to visit ICU nurses from hospital stay. Discussed safety awareness and head turns to know where body is in space and protect head. Pt able to hug nurses and stand for extended period without rest break. OT continued to discuss safety awareness within community environment as pt accessed elevator and walked outside. Practiced walking on uneven surfaces and inclines. Pt ambulated back to rehab department and completed 3 mins x5 on Sci Fit arm bike on level 4 with extended rest breaks in between. Worked on shoulder ROM, scap elevation/depression, and neck ROM. Reviewed HEP given to pt by previous OT. Pt practiced tub bench transfer with supervision to step in and out of tub. Educated pt on home safety modifications within BADL tasks, which both mother and pt verbalized understanding. Pt returned to room at end of session and left seated in straight back chair with mother present and needs met.  Patient has met 12 of 12 long term goals due to improved activity tolerance, improved balance, postural control, ability to compensate for deficits, improved attention, improved awareness and improved coordination.  Patient to discharge at overall mod I/Supervision level.  Patient's care partner is independent to provide the necessary physical assistance at discharge.    Reasons goals not met: n/a   Recommendation:  Patient will benefit from ongoing skilled OT services in outpatient setting to continue to advance functional skills in the area of BADL.  Equipment: shower seat-however pt plans to purchase privately  Reasons for discharge: treatment  goals met and discharge from hospital  Patient/family agrees with progress made and goals achieved: Yes  OT Discharge Precautions/Restrictions  Precautions Precautions: Fall Precaution Comments: skull flap out on R Restrictions Weight Bearing Restrictions: No Pain Pain Assessment Pain Score: 0-No pain ADL ADL ADL Comments: Please see functional navigator Perception  Perception: Within Functional Limits Praxis Praxis: Intact Cognition Overall Cognitive Status: Within Functional Limits for tasks assessed Arousal/Alertness: Awake/alert Orientation Level: Oriented X4 Sensation Sensation Light Touch: Appears Intact Hot/Cold: Appears Intact Proprioception: Appears Intact Stereognosis: Appears Intact Coordination Gross Motor Movements are Fluid and Coordinated: Yes Fine Motor Movements are Fluid and Coordinated: Yes Motor  Motor Motor: Within Functional Limits Motor - Discharge Observations: Improved since eval Mobility  Bed Mobility Rolling Right: Independent Rolling Left: Independent Supine to Sit: Independent Sit to Supine: Independent  Trunk/Postural Assessment  Cervical Assessment Cervical Assessment: (fwd head, decreased rotation bilat) Thoracic Assessment Thoracic Assessment: (very tight, decreased mobility) Lumbar Assessment Lumbar Assessment: (tightness limiting mobility) Postural Control Trunk Control: improving  Balance Balance Balance Assessed: Yes Standardized Balance Assessment Static Sitting Balance Static Sitting - Level of Assistance: 7: Independent Dynamic Sitting Balance Dynamic Sitting - Level of Assistance: 7: Independent Static Standing Balance Static Standing - Level of Assistance: 7: Independent Dynamic Standing Balance Dynamic Standing - Level of Assistance: 7: Independent Extremity/Trunk Assessment RUE Assessment RUE Assessment: Exceptions to [facility] Passive Range of Motion (PROM) Comments: sh flex 160  - stiffness at end range of sh  flex Active Range of Motion (AROM) Comments: sh flex 100; WFL distally General Strength Comments: sh flex 4-/5; distally 4/5 LUE Assessment LUE Assessment: Exceptions to [facility] Passive Range of Motion (PROM) Comments: sh flex 150 - tight  at end range Active Range of Motion (AROM) Comments: sh flex 90 - WFL distally General Strength Comments: 4-/5 sh flex, 4/5 distally  See Function Navigator for Current Functional Status.  Daneen Schick Kaide Gage 06/15/2018, 12:56 PM

## 2018-06-15 NOTE — Progress Notes (Signed)
[place] PHYSICAL MEDICINE & REHABILITATION     PROGRESS NOTE  Subjective/Complaints:  No problems over the weekend.  ROS: Patient denies fever, rash, sore throat, blurred vision, nausea, vomiting, diarrhea, cough, shortness of breath or chest pain, joint or back pain, headache, or mood change.  .    Objective: Vital Signs: Blood pressure 137/73, pulse 85, temperature 97.8 F (36.6 C), temperature source Oral, resp. rate 16, height 5\' 6"  (1.676 m), weight 67.8 kg (149 lb 7.6 oz), SpO2 95 %. No results found. No results for input(s): WBC, HGB, HCT, PLT in the last 72 hours. No results for input(s): NA, K, CL, GLUCOSE, BUN, CREATININE, CALCIUM in the last 72 hours.  Invalid input(s): CO CBG (last 3)  No results for input(s): GLUCAP in the last 72 hours.  Wt Readings from Last 3 Encounters:  06/15/18 67.8 kg (149 lb 7.6 oz)  06/04/18 70.9 kg (156 lb 4.9 oz)  01/08/17 66.4 kg (146 lb 5 oz)    Physical Exam:  BP 137/73 (BP Location: Right Arm)   Pulse 85   Temp 97.8 F (36.6 C) (Oral)   Resp 16   Ht 5\' 6"  (1.676 m)   Wt 67.8 kg (149 lb 7.6 oz)   SpO2 95%   BMI 24.13 kg/m  Constitutional: No distress . Vital signs reviewed. HEENT: EOMI, oral membranes moist Neck: supple Cardiovascular: RRR without murmur. No JVD    Respiratory: CTA Bilaterally without wheezes or rales. Normal effort    GI: BS +, non-tender, non-distended  Musculoskeletal: No edema or tenderness in extremities  Neurological: He is alert.  Alert and Oriented x3 Normal language. Improving insight and awareness. Improved processing.  Motor: RUE/RLE: 5/5 proximal to distal LUE: 4 +/5 proximal to distal, negative pronator drift LLE: 4+-5/5 proximal to distal  Skin: scalp CDI Psychiatric: pleasant and polite.   Assessment/Plan: 1. 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 Physiatrist is  providing close team supervision and 24 hour management of active medical problems listed below. Physiatrist and rehab team continue to assess barriers to discharge/monitor patient progress toward functional and medical goals.  Function:  Bathing Bathing position   Position: Shower  Bathing parts Body parts bathed by patient: Right arm, Left arm, Chest, Abdomen, Front perineal area, Buttocks, Right upper leg, Left upper leg, Right lower leg, Left lower leg, Back Body parts bathed by helper: Back  Bathing assist Assist Level: Supervision or verbal cues      Upper Body Dressing/Undressing Upper body dressing   What is the patient wearing?: Pull over shirt/dress     Pull over shirt/dress - Perfomed by patient: Thread/unthread right sleeve, Thread/unthread left sleeve, Put head through opening, Pull shirt over trunk          Upper body assist Assist Level: Supervision or verbal cues   Set up : To obtain clothing/put away  Lower Body Dressing/Undressing Lower body dressing   What is the patient wearing?: Underwear, Pants, Shoes, Socks Underwear - Performed by patient: Thread/unthread right underwear leg, Thread/unthread left underwear leg, Pull underwear up/down   Pants- Performed by patient: Thread/unthread right pants leg, Thread/unthread left pants leg, Pull pants up/down   Non-skid slipper socks- Performed by patient: Don/doff right sock, Don/doff left sock   Socks - Performed by patient: Don/doff right sock, Don/doff left sock   Shoes - Performed by patient: Don/doff right shoe, Don/doff left shoe, Fasten right, [employer] Communications[employer]en  left            Lower body assist Assist for lower body dressing: Supervision or verbal cues      Toileting Toileting   Toileting steps completed by patient: Performs perineal hygiene, Adjust clothing after toileting, Adjust clothing prior to toileting Toileting steps completed by helper: Adjust clothing prior to toileting, Performs perineal hygiene,  Adjust clothing after toileting(per Carla Drape, NT) Toileting Assistive Devices: Grab bar or rail  Toileting assist Assist level: Supervision or verbal cues   Transfers Chair/bed transfer   Chair/bed transfer method: Ambulatory Chair/bed transfer assist level: No Help, no cues, assistive device, takes more than a reasonable amount of time Chair/bed transfer assistive device: Armrests     Locomotion Ambulation     Max distance: 150 Assist level: Supervision or verbal cues   Wheelchair   Type: Manual Max wheelchair distance: 150' Assist Level: Supervision or verbal cues  Cognition Comprehension Comprehension assist level: Follows basic conversation/direction with no assist  Expression Expression assist level: Expresses basic needs/ideas: With no assist  Social Interaction Social Interaction assist level: Interacts appropriately 90% of the time - Needs monitoring or encouragement for participation or interaction.  Problem Solving Problem solving assist level: Solves basic 75 - 89% of the time/requires cueing 10 - 24% of the time  Memory Memory assist level: Recognizes or recalls 75 - 89% of the time/requires cueing 10 - 24% of the time    Medical Problem List and Plan: 1.  Deficits with mobility, endurance, strength, cognition, self-care secondary to polytrauma with TBI.  -making progress toward goals  -ELOS 7/16 2. Acute bilateral gastrocnemius DVT (05/14/18)/Anticoagulation:   Repeat vascular Dopplers negative for DVT 7/4  -ambulatory. Off lovenox now  3. Pain Management: Wean oxycodone as tolerated.  4. Mood: LCSW to follow for evaluation and support.  5. Neuropsych: This patient is not fully capable of making decisions on his own behalf. 6. Skin/Wound Care: Continue foam dressings to back where skin has sloughed off. Routine pressure relief measures.  7. Fluids/Electrolytes/Nutrition: Monitor I/O as on dysphagia 2, nectars.    Advance diet as tolerated 8.  Pseudomonas UTI/Staph bacteremia with leucocytosis: Has completed 7 day course of Fortaz and Vancomycin on 7/3.  9. Delirium:   Trazodone for sleep as needed only 10. Acute respiratory failure: Resolved.  11. Bilateral rib fractures/large R-PTX/Pulmonary contusion: Encourage IS  12. ABLA: Stable overall. Continue to monitor with serial checks.   Hemoglobin 11.9 7/9  Continue to monitor 13. Liver laceration: Abnormal LFTs due to liver trauma. Continue to monitor.   improving 14. HTN with tachycardia:   Likely reactive, monitor with increased mobility 15. Hypokalemia  Potassium 4.0   7/9  LOS (Days) 11 A FACE TO FACE EVALUATION WAS PERFORMED  Ranelle Oyster 06/15/2018 8:50 AM

## 2018-06-15 NOTE — Progress Notes (Signed)
Physical Therapy Discharge Summary  Patient Details  Name: Frederick Molina. MRN: [id number] Date of Birth: 11-04-1986  Today's Date: 06/15/2018 PT Individual Time: 0930-1000 PT Individual Time Calculation (min): 30 min   Pt performed gait throughout unit with mod I including household, uneven and simulated community surfaces.  Pt performs stair negotiation with mod I x 12 steps with 1 handrail.  Supine and quadruped back stretching and strengthening with pt able to recall 75% of stretching program without cues.  Berg balance test performed with pt scoring 54/56, low fall risk.  Pt/mother educated on importance of energy conservation and supervision in community and busy environments, both verbalize understanding.  Pt and mother both state they feel comfortable and ready for d/c home tomorrow.  Patient has met 6 of 6 long term goals due to improved activity tolerance, improved balance, improved postural control, increased strength, ability to compensate for deficits, improved awareness and improved coordination.  Patient to discharge at an ambulatory level Modified Independent.   Patient's care partner is independent to provide the necessary supervision assistance at discharge.  Reasons goals not met: n/a  Recommendation:  Patient will benefit from ongoing skilled PT services in outpatient setting to continue to advance safe functional mobility, address ongoing impairments in balance, dynamic gait, and minimize fall risk.  Equipment: No equipment provided  Reasons for discharge: treatment goals met and discharge from hospital  Patient/family agrees with progress made and goals achieved: Yes  PT Discharge Precautions/Restrictions Precautions Precaution Comments: skull flap out on R Restrictions Weight Bearing Restrictions: No Pain Pain Assessment Pain Score: 0-No pain  Cognition Overall Cognitive Status: Within Functional Limits for tasks assessed Arousal/Alertness:  Awake/alert Sensation Sensation Light Touch: Appears Intact Proprioception: Appears Intact Coordination Gross Motor Movements are Fluid and Coordinated: Yes Motor  Motor Motor - Discharge Observations: improving strength  Mobility Bed Mobility Rolling Right: Independent Rolling Left: Independent Supine to Sit: Independent Sit to Supine: Independent Transfers Stand Pivot Transfers: Independent Locomotion  Gait Gait Distance (Feet): 200 Feet Assistive device: None Stairs / Additional Locomotion Stairs: Yes Stairs Assistance: Supervision/Verbal cueing Stair Management Technique: One rail Right Number of Stairs: 12 Ramp: Independent Curb: Supervision/Verbal cueing Wheelchair Mobility Wheelchair Mobility: No  Trunk/Postural Assessment  Cervical Assessment Cervical Assessment: (fwd head, decreased rotation bilat) Thoracic Assessment Thoracic Assessment: (very tight, decreased mobility) Lumbar Assessment Lumbar Assessment: (tightness limiting mobility) Postural Control Trunk Control: improving  Balance Standardized Balance Assessment Standardized Balance Assessment: Berg Balance Test Berg Balance Test Sit to Stand: Able to stand without using hands and stabilize independently Standing Unsupported: Able to stand safely 2 minutes Sitting with Back Unsupported but Feet Supported on Floor or Stool: Able to sit safely and securely 2 minutes Stand to Sit: Sits safely with minimal use of hands Transfers: Able to transfer safely, minor use of hands Standing Unsupported with Eyes Closed: Able to stand 10 seconds safely Standing Ubsupported with Feet Together: Able to place feet together independently and stand 1 minute safely From Standing, Reach Forward with Outstretched Arm: Can reach forward >12 cm safely (5") From Standing Position, Pick up Object from Floor: Able to pick up shoe safely and easily From Standing Position, Turn to Look Behind Over each Shoulder: Looks behind  one side only/other side shows less weight shift Turn 360 Degrees: Able to turn 360 degrees safely in 4 seconds or less Standing Unsupported, Alternately Place Feet on Step/Stool: Able to stand independently and safely and complete 8 steps in 20 seconds Standing Unsupported, One Foot in  Front: Able to place foot tandem independently and hold 30 seconds Standing on One Leg: Able to lift leg independently and hold > 10 seconds Total Score: 54 Extremity Assessment      RLE Assessment RLE Assessment: Within Functional Limits LLE Assessment LLE Assessment: Within Functional Limits   See Function Navigator for Current Functional Status.  Poppi Scantling 06/15/2018, 9:59 AM

## 2018-06-15 NOTE — Progress Notes (Signed)
Speech Language Pathology Session Note & Discharge Summary  Patient Details  Name: Frederick Molina. MRN: [id number] Date of Birth: 1986-11-10  Today's Date: 06/15/2018 SLP Individual Time: 1300-1330 SLP Individual Time Calculation (min): 30 min   Skilled Therapeutic Interventions:  Skilled treatment session focused on cognitive goals. Patient participated in a navigation and scavenger hunt in which he had to locate 5 items in the hospital gift shop. Patient was Mod I for completion of task in regards to recall and problem solving for navigation. Patient left upright in bed with mom present. Continue with current plan of care.   Patient has met 7 of 7 long term goals.  Patient to discharge at overall Modified Independent;Supervision level.   Reasons goals not met: N/A   Clinical Impression/Discharge Summary: Patient has made excellent gains and has met 7 of 7 LTG's this admission. Currently, patient demonstrates behaviors consistent with a Rancho Level VII and requires overall Supervision-Mod I to complete functional and familiar tasks safely in regards to problem solving, recall and awareness. Patient is currently consuming regular textures with thin liquids without overt s/s of aspiration but is utilizing the supraglottic swallow for airway protection. Patient continues to demonstrate a mild hoarse vocal quality from prolonged intubation, however, his overall vocal quality and intensity has improved since admission and patient is 100% intelligible at the conversation level with Mod I. Patient and family education is complete and patient will discharge home with 24 hour supervision from family.  Patient would benefit from f/u SLP services to maximize his swallowing and cognitive function in order to maximize his overall functional independence.   Care Partner:  Caregiver Able to Provide Assistance: Yes     Recommendation:  Outpatient SLP;24 hour supervision/assistance  Rationale for SLP  Follow Up: Maximize swallowing safety;Maximize cognitive function and independence;Reduce caregiver burden   Equipment: N/A   Reasons for discharge: Treatment goals met;Discharged from hospital   Patient/Family Agrees with Progress Made and Goals Achieved: Yes   Function:   Cognition Comprehension Comprehension assist level: Follows basic conversation/direction with no assist  Expression   Expression assist level: Expresses basic needs/ideas: With no assist  Social Interaction Social Interaction assist level: Interacts appropriately with others with medication or extra time (anti-anxiety, antidepressant).  Problem Solving Problem solving assist level: Solves complex problems: With extra time  Memory Memory assist level: Requires cues to use assistive device   Christelle Igoe 06/15/2018, 2:01 PM

## 2018-06-15 NOTE — Progress Notes (Signed)
Occupational Therapy Session Note  Patient Details  Name: Frederick Molina. MRN: [id number] Date of Birth: [birth year]  Today's Date: 06/15/2018 OT Individual Time: 0172-0910 OT Individual Time Calculation (min): 60 min    Short Term Goals: Week 1:  OT Short Term Goal 1 (Week 1): Pt will be able to transfer to toilet with S with stand pivot. OT Short Term Goal 2 (Week 1): Pt will be able to complete 3/3 toileting tasks with S.  OT Short Term Goal 3 (Week 1): Pt will be able to complete room exercise program for his shoulders with no cues. OT Short Term Goal 4 (Week 1): Pt will demonstrate improved [place] to cut food with fork and knife.     Skilled Therapeutic Interventions/Progress Updates:    Pt seen this session for family education with his mom on specific exercises he could do at home to focus on B shoulder ROM and standing balance.  Pt continues to have severely limited B shoulder AROM to 80 - 90 degrees, limited scapular AROM, and limited neck rotation and lateral flexion.  Reviewed and practiced exercises to address these deficits with a handwritten HEP: - use of tennis or racquetball for self massage of back muscles with leaning against a wall -pectoral/ant sh capsule stretch with supine stretch over soft small playground ball -PROM with A from mom for neck lateral flexion and scapular glides -table slides - sitting at edge of table sliding arm to increase flexion (pt was able to get to 140+ degrees) -wall slides - sliding hand up wall (pt could get to 140 degrees)  Balance exercises standing at kitchen sink: -squats -heel raises and then toe raises -leg abduction -hamstring curls  Reviewed frequency recommendations.  Pt and mother stated they understood the exercises and were able to return demonstrate.  Pt ambulated back to room with all needs met.  Therapy Documentation Precautions:  Precautions Precautions: Fall Precaution Comments: skull flap out on  R Restrictions Weight Bearing Restrictions: No   Pain: Pain Assessment Pain Score: 0-No pain ADL: ADL ADL Comments: refer to functional navigator  See Function Navigator for Current Functional Status.   Therapy/Group: Individual Therapy  [place] 06/15/2018, 12:39 PM

## 2018-06-15 NOTE — Progress Notes (Signed)
Occupational Therapy Session Note  Patient Details  Name: Frederick Molina. MRN: [id number] Date of Birth: [birth year]  Today's Date: 06/15/2018 OT Individual Time: 0900-0930 OT Individual Time Calculation (min): 30 min    Skilled Therapeutic Interventions/Progress Updates:    Pt greeted standing at the sink brushing his teeth with mother providing supervision. Pt requests to shower. Pt collected clothing and ambulated into bathroom with AD and supervision with no LOB. Bathing completed sit<>stand with tub bench with supervision. He then completed dressing tasks w/ supervision seated on outside of tub bench. Pt left with mother present and needs met.   Therapy Documentation Precautions:  Precautions Precautions: Fall Precaution Comments: skull flap out on R Restrictions Weight Bearing Restrictions: No Pain:  none/denies pain ADL: ADL ADL Comments: refer to functional navigator  See Function Navigator for Current Functional Status.   Therapy/Group: Individual Therapy  Valma Cava 06/15/2018, 9:31 AM

## 2018-06-15 NOTE — Progress Notes (Signed)
Speech Language Pathology Daily Session Note  Patient Details  Name: Frederick ForesterSteven Sult Jr. MRN: [id number] Date of Birth: 27-May-1986  Today's Date: 06/15/2018 SLP Individual Time: 0830-0905 SLP Individual Time Calculation (min): 35 min  Short Term Goals: Week 2: SLP Short Term Goal 1 (Week 2): STG=LTGs  Skilled Therapeutic Interventions: Skilled treatment session focused on family education and cognitive goals. SLP facilitated session by providing education to the patient's mother in regards to his current cognitive and swallowing function and strategies to utilize at home to maximize overall safety with functional tasks and eating. She verbalized understanding of all information and all questions were answered at this time. SLP also facilitated session by re-administering the MoCA (version 7.3). Patient scored 26/30 points with a score of 26 or above considered normal. Patient continues to demonstrate deficits in short-term recall but demonstrated a 5 point improvement since initial assessment last week. Patient left upright in chair with mom present. Continue with current plan of care.      Function:   Cognition Comprehension Comprehension assist level: Follows basic conversation/direction with no assist  Expression   Expression assist level: Expresses basic needs/ideas: With no assist  Social Interaction Social Interaction assist level: Interacts appropriately with others with medication or extra time (anti-anxiety, antidepressant).  Problem Solving Problem solving assist level: Solves complex 90% of the time/cues < 10% of the time  Memory Memory assist level: Recognizes or recalls 90% of the time/requires cueing < 10% of the time    Pain Pain Assessment Pain Score: 0-No pain  Therapy/Group: Individual Therapy  Halle Davlin 06/15/2018, 12:47 PM

## 2018-06-16 MED ORDER — METOPROLOL TARTRATE 25 MG PO TABS: 25.0000 mg | ORAL_TABLET | Freq: Two times a day (BID) | ORAL | 0 refills | Status: DC

## 2018-06-16 NOTE — Discharge Instructions (Signed)
Inpatient Rehab Discharge Instructions  Frederick HarriWarnell Foresterson Jr. Discharge date and time:   06/16/18  Activities/Precautions/ Functional Status: Activity: no lifting, driving, or strenuous exercise till cleared by MD Diet: regular diet Wound Care: keep wound clean and dry Contact MD if you develop any problems with your incision/wound--redness, swelling, increase in pain, drainage or if you develop fever or chills.    Functional status:  ___ No restrictions     ___ Walk up steps independently _X__ 24/7 supervision/assistance   ___ Walk up steps with assistance ___ Intermittent supervision/assistance  ___ Bathe/dress independently ___ Walk with walker     _X__ Bathe/dress with supervision  ___ Walk Independently    ___ Shower independently ___ Walk with assistance    ___ Shower with assistance _X__ No alcohol     ___ Return to work/school ________    COMMUNITY REFERRALS UPON DISCHARGE:    Outpatient: PT     OT    ST                   Agency:  [facility]   Phone: [phone]                Appointment Date/Time:  7/18 @ 2 - 3:30 (speech and physical therapy) - please arrive at 1:45                                                                   7/22 @ 9:30 am (occupational therapy)   GENERAL COMMUNITY RESOURCES FOR PATIENT/FAMILY:  Support Groups:  [place] [facility]  (see handout)    Special Instructions:    My questions have been answered and I understand these instructions. I will adhere to these goals and the provided educational materials after my discharge from the hospital.  Patient/Caregiver Signature _______________________________ Date __________  Clinician Signature _______________________________________ Date __________  Please bring this form and your medication list with you to all your follow-up doctor's appointments.

## 2018-06-16 NOTE — Progress Notes (Signed)
Social Work  Discharge Note  The overall goal for the admission was met for:   Discharge location: Yes - home with friend and mother to provide assistance  Length of Stay: Yes - 12 days  Discharge activity level: Yes - supervision to independent  Home/community participation: Yes  Services provided included: MD, RD, PT, OT, SLP, RN, TR, Pharmacy, Neuropsych and SW  Financial Services:  Medicaid and SSD applications pending at time of d/c  Follow-up services arranged: Outpatient: PT, OT, ST via [facility] and Patient/Family has no preference for HH/DME agencies  Comments (or additional information):  Patient/Family verbalized understanding of follow-up arrangements: Yes  Individual responsible for coordination of the follow-up plan: pt  Confirmed correct DME delivered: NA - no DME needs    Frederick Molina
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
## 2018-06-16 NOTE — Progress Notes (Signed)
Pt. Got d/c instructions and follow appointments.Pt. Is ready to go home with his family.

## 2018-06-18 ENCOUNTER — Ambulatory Visit: Payer: Self-pay | Attending: Physical Medicine & Rehabilitation

## 2018-06-18 ENCOUNTER — Telehealth: Payer: Self-pay | Admitting: Registered Nurse

## 2018-06-18 ENCOUNTER — Ambulatory Visit: Payer: Self-pay

## 2018-06-18 DIAGNOSIS — M25512 Pain in left shoulder: Secondary | ICD-10-CM | POA: Insufficient documentation

## 2018-06-18 DIAGNOSIS — R41844 Frontal lobe and executive function deficit: Secondary | ICD-10-CM | POA: Insufficient documentation

## 2018-06-18 DIAGNOSIS — M25611 Stiffness of right shoulder, not elsewhere classified: Secondary | ICD-10-CM | POA: Insufficient documentation

## 2018-06-18 DIAGNOSIS — M6281 Muscle weakness (generalized): Secondary | ICD-10-CM | POA: Insufficient documentation

## 2018-06-18 DIAGNOSIS — M25612 Stiffness of left shoulder, not elsewhere classified: Secondary | ICD-10-CM | POA: Insufficient documentation

## 2018-06-18 DIAGNOSIS — M542 Cervicalgia: Secondary | ICD-10-CM | POA: Insufficient documentation

## 2018-06-18 DIAGNOSIS — R41841 Cognitive communication deficit: Secondary | ICD-10-CM | POA: Insufficient documentation

## 2018-06-18 DIAGNOSIS — M25511 Pain in right shoulder: Secondary | ICD-10-CM | POA: Insufficient documentation

## 2018-06-18 NOTE — Patient Instructions (Signed)
   [facility]

## 2018-06-18 NOTE — Telephone Encounter (Signed)
Transitional Care call Transitional Care Call Questions answered by his mother: Ms. Micah NoelVelma Teng  Patient name: Frederick LittlerSteven Hepp DOB: 1986/02/14 1. Are you/is patient experiencing any problems since coming home? No a. Are there any questions regarding any aspect of care? No 2. Are there any questions regarding medications administration/dosing? No  a. Are meds being taken as prescribed? Yes b. "Patient should review meds with caller to confirm" Medication List Reviewed 3. Have there been any falls? No 4. Has Home Health been to the house and/or have they contacted you? Yes, [facility] a. If not, have you tried to contact them? NA b. Can we help you contact them? NA 5. Are bowels and bladder emptying properly? Yes a. Are there any unexpected incontinence issues? No b. If applicable, is patient following bowel/bladder programs? NA 6. Any fevers, problems with breathing, unexpected pain? No 7. Are there any skin problems or new areas of breakdown? No 8. Has the patient/family member arranged specialty MD follow up (ie cardiology/neurology/renal/surgical/etc.)?  Yes a. Can we help arrange? NA 9. Does the patient need any other services or support that we can help arrange? No 10. Are caregivers following through as expected in assisting the patient? Yes 11. Has the patient quit smoking, drinking alcohol, or using drugs as recommended? Ms. Romeo AppleHarrison 12.  ( mother), states Mr. Romeo AppleHarrison doesn't smoke, drink alcohol or use illicit drugs.   Appointment date/time 06/29/2018, appointment time 10:40 for 11:00 appointment with Jacalyn LefevreEunice Ashantae Pangallo ANP. At [address]
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
## 2018-06-22 ENCOUNTER — Ambulatory Visit: Payer: Self-pay | Admitting: Occupational Therapy

## 2018-06-22 ENCOUNTER — Other Ambulatory Visit: Payer: Self-pay

## 2018-06-22 ENCOUNTER — Encounter: Payer: Self-pay | Admitting: Occupational Therapy

## 2018-06-22 DIAGNOSIS — M25612 Stiffness of left shoulder, not elsewhere classified: Secondary | ICD-10-CM

## 2018-06-22 DIAGNOSIS — M6281 Muscle weakness (generalized): Secondary | ICD-10-CM

## 2018-06-22 DIAGNOSIS — M25511 Pain in right shoulder: Secondary | ICD-10-CM

## 2018-06-22 DIAGNOSIS — M25611 Stiffness of right shoulder, not elsewhere classified: Secondary | ICD-10-CM

## 2018-06-22 DIAGNOSIS — M25512 Pain in left shoulder: Secondary | ICD-10-CM

## 2018-06-22 DIAGNOSIS — R41844 Frontal lobe and executive function deficit: Secondary | ICD-10-CM
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
## 2018-06-25 ENCOUNTER — Encounter: Payer: Self-pay | Admitting: Physical Therapy

## 2018-06-25 ENCOUNTER — Ambulatory Visit: Payer: Self-pay | Admitting: Physical Therapy

## 2018-06-25 DIAGNOSIS — M542 Cervicalgia: Secondary | ICD-10-CM

## 2018-06-25 NOTE — Patient Instructions (Signed)
  Setup  Begin sitting in an upright position with a rolled towel around your neck. Hold each end of the towel with your hands crossed. Movement  Lift your front hand upward until the towel is around the side of your head, then pull sideways, rotating your neck. Then bring your neck back to center and repeat. Tip  Avoid bending your neck forward or backward. Only rotate your neck within a pain-free range of motion, and make sure to move slowly.    Setup  Begin sitting upright in a chair holding the ends of a strap wrapped around the lower part of your neck. Movement  Gently tuck your chin, then slowly bend your neck backward. Bring your head back up to the starting position and repeat. Tip  Make sure your neck bends directly backward, do not let it rotate to either side.   Also perform gentle ROM side to side, up/down and diagonal patterns.
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
## 2018-06-29 ENCOUNTER — Encounter: Payer: Self-pay | Attending: Registered Nurse | Admitting: Registered Nurse

## 2018-06-29 ENCOUNTER — Encounter: Payer: Self-pay | Admitting: Registered Nurse

## 2018-06-29 VITALS — BP 122/78 | HR 71 | Ht 65.0 in | Wt 150.0 lb

## 2018-06-29 DIAGNOSIS — S0291XD Unspecified fracture of skull, subsequent encounter for fracture with routine healing: Secondary | ICD-10-CM | POA: Insufficient documentation

## 2018-06-29 DIAGNOSIS — S0291XB Unspecified fracture of skull, initial encounter for open fracture: Secondary | ICD-10-CM

## 2018-06-29 DIAGNOSIS — M25512 Pain in left shoulder: Secondary | ICD-10-CM | POA: Insufficient documentation

## 2018-06-29 DIAGNOSIS — S069X9A Unspecified intracranial injury with loss of consciousness of unspecified duration, initial encounter: Secondary | ICD-10-CM

## 2018-06-29 DIAGNOSIS — Z9889 Other specified postprocedural states: Secondary | ICD-10-CM | POA: Insufficient documentation

## 2018-06-29 DIAGNOSIS — I10 Essential (primary) hypertension: Secondary | ICD-10-CM | POA: Insufficient documentation

## 2018-06-29 DIAGNOSIS — S069X9D Unspecified intracranial injury with loss of consciousness of unspecified duration, subsequent encounter: Secondary | ICD-10-CM | POA: Insufficient documentation

## 2018-06-29 DIAGNOSIS — Z87891 Personal history of nicotine dependence: Secondary | ICD-10-CM | POA: Insufficient documentation

## 2018-06-29 DIAGNOSIS — M25511 Pain in right shoulder: Secondary | ICD-10-CM | POA: Insufficient documentation

## 2018-06-29 DIAGNOSIS — Z833 Family history of diabetes mellitus: Secondary | ICD-10-CM | POA: Insufficient documentation

## 2018-06-29 DIAGNOSIS — S0990XA Unspecified injury of head, initial encounter: Secondary | ICD-10-CM

## 2018-06-29 DIAGNOSIS — X58XXXD Exposure to other specified factors, subsequent encounter: Secondary | ICD-10-CM | POA: Insufficient documentation

## 2018-06-29 DIAGNOSIS — M545 Low back pain: Secondary | ICD-10-CM | POA: Insufficient documentation

## 2018-06-29 DIAGNOSIS — Z8249 Family history of ischemic heart disease and other diseases of the circulatory system: Secondary | ICD-10-CM | POA: Insufficient documentation
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
## 2018-06-30 ENCOUNTER — Ambulatory Visit: Payer: Self-pay | Admitting: Occupational Therapy

## 2018-06-30 ENCOUNTER — Encounter: Payer: Self-pay | Admitting: Speech Pathology

## 2018-06-30 ENCOUNTER — Encounter: Payer: Self-pay | Admitting: Occupational Therapy

## 2018-06-30 ENCOUNTER — Ambulatory Visit: Payer: Self-pay | Admitting: Speech Pathology

## 2018-06-30 ENCOUNTER — Ambulatory Visit: Payer: Self-pay | Attending: Physician Assistant | Admitting: Physician Assistant

## 2018-06-30 DIAGNOSIS — R41844 Frontal lobe and executive function deficit: Secondary | ICD-10-CM

## 2018-06-30 DIAGNOSIS — Z8249 Family history of ischemic heart disease and other diseases of the circulatory system: Secondary | ICD-10-CM | POA: Insufficient documentation

## 2018-06-30 DIAGNOSIS — R40243 Glasgow coma scale score 3-8, unspecified time: Secondary | ICD-10-CM | POA: Insufficient documentation

## 2018-06-30 DIAGNOSIS — M6281 Muscle weakness (generalized): Secondary | ICD-10-CM

## 2018-06-30 DIAGNOSIS — M25612 Stiffness of left shoulder, not elsewhere classified: Secondary | ICD-10-CM

## 2018-06-30 DIAGNOSIS — Z7689 Persons encountering health services in other specified circumstances: Secondary | ICD-10-CM | POA: Insufficient documentation

## 2018-06-30 DIAGNOSIS — S069X9A Unspecified intracranial injury with loss of consciousness of unspecified duration, initial encounter: Secondary | ICD-10-CM | POA: Insufficient documentation

## 2018-06-30 DIAGNOSIS — I10 Essential (primary) hypertension: Secondary | ICD-10-CM | POA: Insufficient documentation

## 2018-06-30 DIAGNOSIS — M25512 Pain in left shoulder: Secondary | ICD-10-CM

## 2018-06-30 DIAGNOSIS — Z79899 Other long term (current) drug therapy: Secondary | ICD-10-CM | POA: Insufficient documentation

## 2018-06-30 DIAGNOSIS — M25511 Pain in right shoulder: Secondary | ICD-10-CM

## 2018-06-30 DIAGNOSIS — S0291XA Unspecified fracture of skull, initial encounter for closed fracture: Secondary | ICD-10-CM | POA: Insufficient documentation

## 2018-06-30 DIAGNOSIS — M25611 Stiffness of right shoulder, not elsewhere classified: Secondary | ICD-10-CM

## 2018-06-30 DIAGNOSIS — R41841 Cognitive communication deficit: Secondary | ICD-10-CM

## 2018-06-30 MED ORDER — METOPROLOL TARTRATE 25 MG PO TABS: 25.0000 mg | ORAL_TABLET | Freq: Two times a day (BID) | ORAL | 3 refills | Status: DC

## 2018-06-30 NOTE — Patient Instructions (Signed)
Memory Compensation Strategies  1. Use "WARM" strategy. W= write it down A=  associate it R=  repeat it M=  make a mental picture  2. You can keep a Glass blower/designerMemory Notebook. Use a 3-ring notebook with sections for the following:  calendar, important names and phone numbers, medications, doctors' names/phone numbers, "to do list"/reminders, and a section to journal what you did each day  3. Use a calendar to write appointments down.  4. Write yourself a schedule for the day.  This can be placed on the calendar or in a separate section of the Memory Notebook.  Keeping a regular schedule can help memory.  5. Use medication organizer with sections for each day or morning/evening pills  You may need help loading it  6. Keep a basket, or pegboard by the door.   Place items that you need to take out with you in the basket or on the pegboard.  You may also want to include a message board for reminders.  7. Use sticky notes. Place sticky notes with reminders in a place where the task is performed.  For example:  "turn off the stove" placed by the stove, "lock the door" placed on the door at eye level, "take your medications" on the bathroom mirror or by the place where you normally take your medications  8. Use alarms/timers.  Use while cooking to remind yourself to check on food or as a reminder to take your medicine, or as a reminder to make a call, or as a reminder to perform another task, etc.  9. Use a small tape recorder to record important information and notes for yourself.   Tips to help facilitate better attention, concentration, focus   Do harder, longer tasks when you are most alert/awake  Break down larger tasks into small parts  Limit distractions of TV, radio, conversation, e mails/texts, appliance noise, etc - if a job is important, do it in a quiet room  Be aware of how you are functioning in high stimulation environments such as large stores, parties, restaurants - any place  with lots of lights, noise, signs etc  Group conversations may be more difficult to process than one on one conversations  Give yourself extra time to process conversation, reading materials, directions or information from your healthcare providers  Organization is key - clutters of laundry, mail, paperwork, dirty dishes - all make it more difficult to concentrate  Before you start a task, have all the needed supplies, directions, recipes ready and organized. This way you don't have to go looking for something in the middle of a task and become distracted.   Be aware of fatigue - take rests or breaks when needed to re-group and re-focus   Cognitive Activities you can do at home:   - Solitaire  - Majong  - Scrabble  - Chess/Checkers  - Crosswords (easy level)  - Juanna CaoUno  - Card Games  - Board Games  - Connect 4  - Simon  - the Memory Game  - Dominoes  - Backgammon  - jig saw puzzles  On your computer, tablet or phone: [employer] Insurance[employer] Memory Match Game App [employer] Hour - (Get my car out) [employer] IQ Logic Spot the difference games
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
## 2018-06-30 NOTE — Patient Instructions (Signed)
   Flexion (Eccentric) - Active-Assist (Cane)    Use LEFT arm to push RIGHT arm forward. Avoid hiking shoulder. Keep THUMB FACING UP. Slowly lower affected arm for 3-5 seconds, increasing use of affected arm. 10 reps per set, 2 sets per day.       ROM: Abduction - Wand   Holding wand with left hand palm up, push wand directly out to side, leading with other hand palm down, until stretch is felt. Hold 3 seconds. Repeat 10 times per set. Do 2 sessions per day. (Lying down)   ROM: Extension - Wand (Standing)   Stand holding wand behind back. Raise arms as far as possible.  PALMS FACING FORWARD Repeat 10 times per set.  Do 2 sessions per day.    Cane Exercise: External Rotation    Lie with elbows on surface, even with shoulders. Hold cane above chest, palms toward toes. Lower arms back as far as possible. Keep elbows on surface. Hold 3 seconds. Repeat 10 times. Do 1 sessions per day.
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
## 2018-07-02 ENCOUNTER — Ambulatory Visit: Payer: Self-pay | Attending: Physical Medicine & Rehabilitation | Admitting: Speech Pathology

## 2018-07-02 ENCOUNTER — Encounter: Payer: Self-pay | Admitting: Speech Pathology

## 2018-07-02 DIAGNOSIS — M25512 Pain in left shoulder: Secondary | ICD-10-CM | POA: Insufficient documentation

## 2018-07-02 DIAGNOSIS — R41844 Frontal lobe and executive function deficit: Secondary | ICD-10-CM | POA: Insufficient documentation

## 2018-07-02 DIAGNOSIS — R41841 Cognitive communication deficit: Secondary | ICD-10-CM | POA: Insufficient documentation

## 2018-07-02 DIAGNOSIS — M25611 Stiffness of right shoulder, not elsewhere classified: Secondary | ICD-10-CM | POA: Insufficient documentation

## 2018-07-02 DIAGNOSIS — M25511 Pain in right shoulder: Secondary | ICD-10-CM | POA: Insufficient documentation

## 2018-07-02 DIAGNOSIS — M6281 Muscle weakness (generalized): Secondary | ICD-10-CM | POA: Insufficient documentation

## 2018-07-02 DIAGNOSIS — M25612 Stiffness of left shoulder, not elsewhere classified: Secondary | ICD-10-CM | POA: Insufficient documentation
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
## 2018-07-07 ENCOUNTER — Encounter: Payer: Self-pay | Admitting: Occupational Therapy

## 2018-07-07 ENCOUNTER — Ambulatory Visit: Payer: Self-pay | Admitting: Occupational Therapy

## 2018-07-07 DIAGNOSIS — M25511 Pain in right shoulder: Secondary | ICD-10-CM

## 2018-07-07 DIAGNOSIS — M25612 Stiffness of left shoulder, not elsewhere classified: Secondary | ICD-10-CM

## 2018-07-07 DIAGNOSIS — M25611 Stiffness of right shoulder, not elsewhere classified: Secondary | ICD-10-CM

## 2018-07-07 DIAGNOSIS — M25512 Pain in left shoulder: Secondary | ICD-10-CM

## 2018-07-07 DIAGNOSIS — R41844 Frontal lobe and executive function deficit: Secondary | ICD-10-CM

## 2018-07-07 DIAGNOSIS — M6281 Muscle weakness (generalized): Secondary | ICD-10-CM
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
## 2018-07-09 ENCOUNTER — Encounter: Payer: Self-pay | Admitting: Occupational Therapy

## 2018-07-09 ENCOUNTER — Ambulatory Visit: Payer: Self-pay | Admitting: Occupational Therapy

## 2018-07-09 DIAGNOSIS — M25511 Pain in right shoulder: Secondary | ICD-10-CM

## 2018-07-09 DIAGNOSIS — M25512 Pain in left shoulder: Secondary | ICD-10-CM

## 2018-07-09 DIAGNOSIS — R41844 Frontal lobe and executive function deficit: Secondary | ICD-10-CM

## 2018-07-09 DIAGNOSIS — M25612 Stiffness of left shoulder, not elsewhere classified: Secondary | ICD-10-CM

## 2018-07-09 DIAGNOSIS — M6281 Muscle weakness (generalized): Secondary | ICD-10-CM

## 2018-07-09 DIAGNOSIS — M25611 Stiffness of right shoulder, not elsewhere classified: Secondary | ICD-10-CM

## 2018-07-09 NOTE — Patient Instructions (Addendum)
Stabilization / Arm Strength    Hold 1 pound weight in each hand. Out to sides Beyond head then  To ceiling Hold each position  5  seconds. Repeat 10 times. Do 1-2 sessions per day.  Perform with each arm.    Weight Exercise: Flexion    Lie on back, 1lb weight in right hand above face. Arm straight as possible, lower weight beyond head.  Keep arm by your ear Repeat 10 times. Do 1-2 sessions per day. Hold last repetitions 5 seconds.  Perform with each arm
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
## 2018-07-13 ENCOUNTER — Other Ambulatory Visit: Payer: Self-pay | Admitting: Physical Medicine and Rehabilitation

## 2018-07-13 ENCOUNTER — Telehealth: Payer: Self-pay | Admitting: Physician Assistant

## 2018-07-13 NOTE — Telephone Encounter (Signed)
Pharmacy did not receive RX sent by Frederick Molina on 06/30/18, I spoke with the pharmacist and gave a verbal prescription exactly how it was submitted by Ms. Noel on 06/30/18, they will be filling it shortly.

## 2018-07-13 NOTE — Telephone Encounter (Signed)
Patient's mother called stating that pharmacy denies having refills of metoprolol tartrate, although in chart says that there 3 refills were authorized. Patient uses [facility] on [address],please follow up with patient.

## 2018-07-14 ENCOUNTER — Ambulatory Visit: Payer: Self-pay | Admitting: Occupational Therapy

## 2018-07-14 ENCOUNTER — Ambulatory Visit: Payer: Self-pay

## 2018-07-14 ENCOUNTER — Encounter: Payer: Self-pay | Admitting: Occupational Therapy

## 2018-07-14 DIAGNOSIS — M25511 Pain in right shoulder: Secondary | ICD-10-CM

## 2018-07-14 DIAGNOSIS — M25512 Pain in left shoulder: Secondary | ICD-10-CM

## 2018-07-14 DIAGNOSIS — R41844 Frontal lobe and executive function deficit: Secondary | ICD-10-CM

## 2018-07-14 DIAGNOSIS — M25612 Stiffness of left shoulder, not elsewhere classified: Secondary | ICD-10-CM

## 2018-07-14 DIAGNOSIS — M6281 Muscle weakness (generalized): Secondary | ICD-10-CM

## 2018-07-14 DIAGNOSIS — R41841 Cognitive communication deficit: Secondary | ICD-10-CM

## 2018-07-14 DIAGNOSIS — M25611 Stiffness of right shoulder, not elsewhere classified: Secondary | ICD-10-CM
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
## 2018-07-14 NOTE — Patient Instructions (Signed)
  Please complete the assigned speech therapy homework prior to your next session and return it to the speech therapist at your next visit.  
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
## 2018-07-17 ENCOUNTER — Ambulatory Visit: Payer: 59 | Admitting: Physical Therapy

## 2018-07-17 ENCOUNTER — Ambulatory Visit: Payer: Self-pay

## 2018-07-17 DIAGNOSIS — R41841 Cognitive communication deficit: Secondary | ICD-10-CM
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
## 2018-07-17 NOTE — Patient Instructions (Signed)
  Please complete the assigned speech therapy homework prior to your next session and return it to the speech therapist at your next visit.  

## 2018-07-22 ENCOUNTER — Ambulatory Visit: Payer: 59 | Admitting: Physical Therapy

## 2018-07-22 ENCOUNTER — Ambulatory Visit: Payer: Self-pay

## 2018-07-22 ENCOUNTER — Ambulatory Visit: Payer: Self-pay | Admitting: Occupational Therapy

## 2018-07-22 DIAGNOSIS — R41841 Cognitive communication deficit: Secondary | ICD-10-CM

## 2018-07-22 DIAGNOSIS — M25611 Stiffness of right shoulder, not elsewhere classified: Secondary | ICD-10-CM

## 2018-07-22 DIAGNOSIS — M25512 Pain in left shoulder: Secondary | ICD-10-CM

## 2018-07-22 DIAGNOSIS — M25612 Stiffness of left shoulder, not elsewhere classified: Secondary | ICD-10-CM

## 2018-07-22 DIAGNOSIS — M6281 Muscle weakness (generalized): Secondary | ICD-10-CM

## 2018-07-22 DIAGNOSIS — M25511 Pain in right shoulder: Secondary | ICD-10-CM
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
## 2018-07-23 NOTE — Patient Instructions (Signed)
  Please complete the assigned speech therapy homework prior to your next session and return it to the speech therapist at your next visit.  

## 2018-07-24 ENCOUNTER — Ambulatory Visit: Payer: Self-pay | Admitting: Occupational Therapy

## 2018-07-24 ENCOUNTER — Ambulatory Visit: Payer: 59 | Admitting: Physical Therapy

## 2018-07-24 ENCOUNTER — Ambulatory Visit: Payer: Self-pay

## 2018-07-24 DIAGNOSIS — M25611 Stiffness of right shoulder, not elsewhere classified: Secondary | ICD-10-CM

## 2018-07-24 DIAGNOSIS — M25511 Pain in right shoulder: Secondary | ICD-10-CM

## 2018-07-24 DIAGNOSIS — M25512 Pain in left shoulder: Secondary | ICD-10-CM

## 2018-07-24 DIAGNOSIS — R41841 Cognitive communication deficit: Secondary | ICD-10-CM

## 2018-07-24 DIAGNOSIS — M6281 Muscle weakness (generalized): Secondary | ICD-10-CM

## 2018-07-24 DIAGNOSIS — M25612 Stiffness of left shoulder, not elsewhere classified: Secondary | ICD-10-CM
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
## 2018-07-24 NOTE — Patient Instructions (Addendum)
  Please complete the assigned speech therapy homework prior to your next session and return it to the speech therapist at your next visit.  
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
## 2018-07-27 ENCOUNTER — Other Ambulatory Visit: Payer: Self-pay

## 2018-07-27 ENCOUNTER — Encounter: Payer: Self-pay | Admitting: Physical Medicine & Rehabilitation

## 2018-07-27 ENCOUNTER — Encounter: Payer: Self-pay | Attending: Registered Nurse | Admitting: Physical Medicine & Rehabilitation

## 2018-07-27 VITALS — BP 104/69 | HR 68 | Ht 65.0 in | Wt 157.0 lb

## 2018-07-27 DIAGNOSIS — M545 Low back pain: Secondary | ICD-10-CM | POA: Insufficient documentation

## 2018-07-27 DIAGNOSIS — Z9889 Other specified postprocedural states: Secondary | ICD-10-CM | POA: Insufficient documentation

## 2018-07-27 DIAGNOSIS — M25512 Pain in left shoulder: Secondary | ICD-10-CM | POA: Insufficient documentation

## 2018-07-27 DIAGNOSIS — S0291XD Unspecified fracture of skull, subsequent encounter for fracture with routine healing: Secondary | ICD-10-CM | POA: Insufficient documentation

## 2018-07-27 DIAGNOSIS — Z87891 Personal history of nicotine dependence: Secondary | ICD-10-CM | POA: Insufficient documentation

## 2018-07-27 DIAGNOSIS — Z8249 Family history of ischemic heart disease and other diseases of the circulatory system: Secondary | ICD-10-CM | POA: Insufficient documentation

## 2018-07-27 DIAGNOSIS — M25511 Pain in right shoulder: Secondary | ICD-10-CM | POA: Insufficient documentation

## 2018-07-27 DIAGNOSIS — I10 Essential (primary) hypertension: Secondary | ICD-10-CM | POA: Insufficient documentation

## 2018-07-27 DIAGNOSIS — Z833 Family history of diabetes mellitus: Secondary | ICD-10-CM | POA: Insufficient documentation

## 2018-07-27 DIAGNOSIS — S069X9D Unspecified intracranial injury with loss of consciousness of unspecified duration, subsequent encounter: Secondary | ICD-10-CM

## 2018-07-27 NOTE — Progress Notes (Signed)
Subjective:    Patient ID: Frederick Gramling., male    DOB: [birth year], 32 y.o.   MRN: [id number]  HPI   Frederick Molina is here in follow up of his TBI. He has continued to progress. He is working hard with therapies at neuro-rehab which include pt,ot, slp. He has had some "swelling" in his right chest wall. NS has signed off. He is sleeping well. Bowels and bladder continue to work without issues. He denies any issues with balances or falls/near-misses. His mood has continued to improve.    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  Pain Inventory Average Pain 0 Pain Right Now 0 My pain is no pain  In the last 24 hours, has pain interfered with the following? General activity no pain Relation with others no pain Enjoyment of life no pain What TIME of day is your pain at its worst? no pain Sleep (in general) Fair  Pain is worse with: no pain Pain improves with: no pain Relief from Meds: no meds  Mobility ability to climb steps?  yes do you drive?  yes  Function employed # of hrs/week on restrictions  Neuro/Psych No problems in this area  Prior Studies Any changes since last visit?  no x-rays  Physicians involved in your care Any changes since last visit?  no Neurosurgeon Dr. Newell Coral   Family History  Problem Relation Age of Onset  . Healthy Mother   . Hypertension Mother   . Healthy Father   . Hypertension Maternal Grandmother   . Diabetes Maternal Grandmother   . Diabetes Maternal Grandfather   . Hypertension Maternal Grandfather    Social History   Socioeconomic History  . Marital status: Single    Spouse name: Not on file  . Number of children: Not on file  . Years of education: Not on file  . Highest education level: Not on file  Occupational History  . Not on file  Social Needs  . Financial resource strain: Not on file  . Food insecurity:    Worry: Not on file    Inability: Not  on file  . Transportation needs:    Medical: Not on file    Non-medical: Not on file  Tobacco Use  . Smoking status: Former Games developer  . Smokeless tobacco: Never Used  Substance and Sexual Activity  . Alcohol use: Not Currently  . Drug use: No  . Sexual activity: Not on file  Lifestyle  . Physical activity:    Days per week: Not on file    Minutes per session: Not on file  . Stress: Not on file  Relationships  . Social connections:    Talks on phone: Not on file    Gets together: Not on file    Attends religious service: Not on file    Active member of club or organization: Not on file    Attends meetings of clubs or organizations: Not on file    Relationship status: Not on file  Other Topics Concern  . Not on file  Social History Narrative   ** Merged History Encounter **       Past Surgical History:  Procedure Laterality Date  . CRANIECTOMY FOR DEPRESSED SKULL FRACTURE N/A 05/01/2018   Procedure: CRANIECTOMY AND REPAIR OF SCALP LACERATIONS;  Surgeon: Shirlean Kelly, MD;  Location: [facility] OR;  Service: Neurosurgery;  Laterality: N/A;  . WISDOM TOOTH  EXTRACTION     Past Medical History:  Diagnosis Date  . Hypertension    BP 104/69   Pulse 68   Ht 5\' 5"  (1.651 m)   Wt 157 lb (71.2 kg)   SpO2 98%   BMI 26.13 kg/m   Opioid Risk Score:   Fall Risk Score:  `1  Depression screen PHQ 2/9  [facility] 07/27/2018 06/30/2018  Decreased Interest 0 0  Down, Depressed, Hopeless 0 0  PHQ - 2 Score 0 0    Review of Systems  Constitutional: Negative.   HENT: Negative.   Eyes: Negative.   Respiratory: Negative.   Cardiovascular: Negative.   Gastrointestinal: Negative.   Endocrine: Negative.   Genitourinary: Negative.   Musculoskeletal: Negative.   Skin: Negative.   Allergic/Immunologic: Negative.   Neurological: Negative.   Hematological: Negative.   Psychiatric/Behavioral: Negative.   All other systems reviewed and are negative.      Objective:    Physical Exam General: No acute distress HEENT: EOMI, oral membranes moist Cards: reg rate  Chest: normal effort Abdomen: Soft, NT, ND Skin: dry, intact Extremities: no edema  Musculoskeletal: No edema or tenderness in extremities Neurological: He is alert.  Alert and Oriented x3 Good language skills. 0/3 serial 7's. And sequencing numbers. Tended to become distracted during attempts.  World forward and backwards without issue. Has difficulties with more with numbers than words. Memory functional remembering 3/3 words after 5 minutes.  Motor: RUE/RLE: 5/5 proximal to distal LUE: 5/5 proximal to distal. LLE: 5/5 proximal to distal, normal balance with tandem gait.  Skin: well healed.  Psychiatric: pleasant and polite.        Assessment & Plan:  1. Deficits with mobility, endurance, strength, cognition, self-care secondary to polytrauma with TBI.             -complete outpt therapies  -gave written instructions to drive  -would like him to complete therapies before returning to work. He will need to speak with his supervisor about steps he needs to follow to return to his job. He should be able to return to work soon as he knows his job well and there is not any "new learning" which needs to occur.   2. HTN: MAY STOP METOPROLOL  3. follow up with me in 2 months. Fifteen minutes of face to face patient care time were spent during this visit. All questions were encouraged and answered.

## 2018-07-27 NOTE — Patient Instructions (Addendum)
RETURN TO DRIVING PLAN:  WITH THE SUPERVISION OF A LICENSED DRIVER, PLEASE DRIVE IN AN EMPTY PARKING LOT FOR AT LEAST 2-3 TRIALS TO TEST REACTION TIME, VISION, USE OF EQUIPMENT IN CAR, ETC.  IF SUCCESSFUL WITH THE PARKING LOT DRIVING, PROCEED TO SUPERVISED DRIVING TRIALS IN YOUR NEIGHBORHOOD STREETS AT LOW TRAFFIC TIMES TO TEST OBSERVATION TO TRAFFIC SIGNALS, REACTION TIME, ETC. PLEASE ATTEMPT AT LEAST 2-3 TRIALS IN YOUR NEIGHBORHOOD.  IF NEIGHBORHOOD DRIVING IS SUCCESSFUL, YOU MAY PROCEED TO DRIVING IN BUSIER AREAS IN YOUR COMMUNITY WITH SUPERVISION OF A LICENSED DRIVER. PLEASE ATTEMPT AT LEAST 4-5 TRIALS.  IF COMMUNITY DRIVING IS SUCCESSFUL, YOU MAY PROCEED TO DRIVING ALONE, DURING THE DAY TIME, IN NON-PEAK TRAFFIC TIMES. YOU SHOULD DRIVE NO FURTHER THAN 30  MINUTES IN ONE DIRECTION. PLEASE DO NOT DRIVE IF YOU FEEL FATIGUED OR UNDER THE INFLUENCE OF MEDICATION.    STOP METOPROLOL   COMPLETE THERAPIES BEFORE GOING BACK TO WORK. YOU NEED TO BE IN CONVERSATION NOW WITH YOUR SUPERVISION WHAT THEY NEED FROM YOUR PROVIDERS TO RELEASE YOU TO WORK. CAN YOU GO BACK PART TIME TO START?

## 2018-07-29 ENCOUNTER — Ambulatory Visit: Payer: Self-pay | Admitting: Occupational Therapy

## 2018-07-29 ENCOUNTER — Ambulatory Visit: Payer: Self-pay

## 2018-07-29 ENCOUNTER — Ambulatory Visit: Payer: 59 | Admitting: Physical Therapy

## 2018-07-29 ENCOUNTER — Encounter: Payer: Self-pay | Admitting: Occupational Therapy

## 2018-07-29 DIAGNOSIS — R41844 Frontal lobe and executive function deficit: Secondary | ICD-10-CM

## 2018-07-29 DIAGNOSIS — M6281 Muscle weakness (generalized): Secondary | ICD-10-CM

## 2018-07-29 DIAGNOSIS — M25611 Stiffness of right shoulder, not elsewhere classified: Secondary | ICD-10-CM

## 2018-07-29 DIAGNOSIS — M25612 Stiffness of left shoulder, not elsewhere classified: Secondary | ICD-10-CM

## 2018-07-29 DIAGNOSIS — M25511 Pain in right shoulder: Secondary | ICD-10-CM

## 2018-07-29 DIAGNOSIS — R41841 Cognitive communication deficit: Secondary | ICD-10-CM

## 2018-07-29 DIAGNOSIS — M25512 Pain in left shoulder: Secondary | ICD-10-CM

## 2018-07-29 NOTE — Patient Instructions (Signed)
  Please complete the assigned speech therapy homework prior to your next session and return it to the speech therapist at your next visit.  
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
## 2018-07-30 ENCOUNTER — Ambulatory Visit: Payer: Self-pay | Admitting: Occupational Therapy

## 2018-07-30 ENCOUNTER — Encounter: Payer: Self-pay | Admitting: Occupational Therapy

## 2018-07-30 DIAGNOSIS — M6281 Muscle weakness (generalized): Secondary | ICD-10-CM

## 2018-07-30 DIAGNOSIS — M25611 Stiffness of right shoulder, not elsewhere classified: Secondary | ICD-10-CM

## 2018-07-30 DIAGNOSIS — M25612 Stiffness of left shoulder, not elsewhere classified: Secondary | ICD-10-CM
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
## 2018-07-31 ENCOUNTER — Ambulatory Visit: Payer: 59 | Admitting: Physical Therapy

## 2018-07-31 ENCOUNTER — Ambulatory Visit: Payer: Self-pay

## 2018-07-31 DIAGNOSIS — R41841 Cognitive communication deficit: Secondary | ICD-10-CM

## 2018-07-31 NOTE — Patient Instructions (Signed)
  Please complete the assigned speech therapy homework prior to your next session and return it to the speech therapist at your next visit.  
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
## 2018-08-04 ENCOUNTER — Ambulatory Visit: Payer: 59 | Admitting: Physical Therapy

## 2018-08-04 ENCOUNTER — Ambulatory Visit: Payer: Self-pay | Attending: Physical Medicine & Rehabilitation | Admitting: Speech Pathology

## 2018-08-04 ENCOUNTER — Ambulatory Visit: Payer: Self-pay | Admitting: Occupational Therapy

## 2018-08-04 DIAGNOSIS — M25611 Stiffness of right shoulder, not elsewhere classified: Secondary | ICD-10-CM | POA: Insufficient documentation

## 2018-08-04 DIAGNOSIS — R41844 Frontal lobe and executive function deficit: Secondary | ICD-10-CM | POA: Insufficient documentation

## 2018-08-04 DIAGNOSIS — M25512 Pain in left shoulder: Secondary | ICD-10-CM

## 2018-08-04 DIAGNOSIS — M25612 Stiffness of left shoulder, not elsewhere classified: Secondary | ICD-10-CM

## 2018-08-04 DIAGNOSIS — M6281 Muscle weakness (generalized): Secondary | ICD-10-CM | POA: Insufficient documentation

## 2018-08-04 DIAGNOSIS — M25511 Pain in right shoulder: Secondary | ICD-10-CM | POA: Insufficient documentation

## 2018-08-04 DIAGNOSIS — R41841 Cognitive communication deficit: Secondary | ICD-10-CM | POA: Insufficient documentation
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
## 2018-08-05 ENCOUNTER — Ambulatory Visit: Payer: Self-pay | Attending: Nurse Practitioner | Admitting: Nurse Practitioner

## 2018-08-05 ENCOUNTER — Encounter: Payer: Self-pay | Admitting: Nurse Practitioner

## 2018-08-05 VITALS — BP 112/75 | HR 82 | Temp 98.5°F | Ht 65.0 in | Wt 159.2 lb

## 2018-08-05 DIAGNOSIS — M7918 Myalgia, other site: Secondary | ICD-10-CM | POA: Insufficient documentation

## 2018-08-05 DIAGNOSIS — Z7689 Persons encountering health services in other specified circumstances: Secondary | ICD-10-CM | POA: Insufficient documentation

## 2018-08-05 DIAGNOSIS — Y92410 Unspecified street and highway as the place of occurrence of the external cause: Secondary | ICD-10-CM | POA: Insufficient documentation

## 2018-08-05 DIAGNOSIS — I10 Essential (primary) hypertension: Secondary | ICD-10-CM | POA: Insufficient documentation

## 2018-08-05 DIAGNOSIS — S20211D Contusion of right front wall of thorax, subsequent encounter: Secondary | ICD-10-CM

## 2018-08-05 DIAGNOSIS — S20211A Contusion of right front wall of thorax, initial encounter: Secondary | ICD-10-CM | POA: Insufficient documentation

## 2018-08-05 MED ORDER — CYCLOBENZAPRINE HCL 10 MG PO TABS: 10.0000 mg | ORAL_TABLET | Freq: Three times a day (TID) | ORAL | 0 refills | Status: AC | PRN

## 2018-08-05 NOTE — Patient Instructions (Signed)
Chest Contusion, Adult  A chest contusion is a deep bruise on the chest. Bruises happen when an injury causes bleeding under the skin. Signs of bruising include pain, puffiness (swelling), and skin that has changed from its normal color. The bruise may turn blue, purple, or yellow. Minor injuries may give you a painless bruise, but worse bruises may stay painful and swollen for a few weeks.  Follow these instructions at home:  · If directed, put ice on the injured area.  ? Put ice in a plastic bag.  ? Place a towel between your skin and the bag.  ? Leave the ice on for 20 minutes, 2-3 times per day.  · Take over-the-counter and prescription medicines only as told by your doctor.  · If told by your doctor, do deep-breathing exercises.  · Do not lie down flat on your back. Keep your head and chest raised (elevated) when you rest or sleep.  · Do not use any products that contain nicotine or tobacco, such as cigarettes and e-cigarettes. If you need help quitting, ask your doctor.  ·  Do not lift anything that causes pain.  Contact a doctor if:  · Medicines or treatment do not help your swelling or pain.  · You have more bruising.  · You have more swelling.  · Your pain gets worse  · Your symptoms do not get better in one week.  Get help right away if:  · You suddenly have a lot more pain.  · You have trouble breathing.  · You feel dizzy or weak.  · You pass out (faint).  · You have blood in your pee (urine) or poop (stool).  · You cough up blood or you throw up (vomit) blood.  Summary  · A chest contusion is a deep bruise on the chest. Bruises happen when an injury causes bleeding under the skin.  · Treatment may include resting and putting ice on the injured area.  · Contact a doctor if you have trouble breathing or if your pain does not get better with treatment.  This information is not intended to replace advice given to you by your health care provider. Make sure you discuss any questions you have with your health  care provider.  Document Released: 05/06/2008 Document Revised: 08/15/2016 Document Reviewed: 08/15/2016  Elsevier Interactive Patient Education © 2017 [employer] Inc[employer]
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
## 2018-08-06 ENCOUNTER — Encounter: Payer: Self-pay | Admitting: Nurse Practitioner

## 2018-08-06 ENCOUNTER — Ambulatory Visit: Payer: Self-pay

## 2018-08-06 ENCOUNTER — Ambulatory Visit: Payer: Self-pay | Admitting: Occupational Therapy

## 2018-08-06 DIAGNOSIS — M6281 Muscle weakness (generalized): Secondary | ICD-10-CM

## 2018-08-06 DIAGNOSIS — R41841 Cognitive communication deficit: Secondary | ICD-10-CM

## 2018-08-06 DIAGNOSIS — M25612 Stiffness of left shoulder, not elsewhere classified: Secondary | ICD-10-CM

## 2018-08-06 DIAGNOSIS — R41844 Frontal lobe and executive function deficit: Secondary | ICD-10-CM

## 2018-08-06 DIAGNOSIS — M25611 Stiffness of right shoulder, not elsewhere classified: Secondary | ICD-10-CM
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
## 2018-08-06 NOTE — Patient Instructions (Signed)
You must do your homework! Coming here twice a week will not do much if you don't practice your cognitive skills throughout the week using your homework that we give to you.
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
## 2018-08-06 NOTE — Patient Instructions (Signed)
Strengthening: Resisted Flexion   Attach tube to door.  Hold tubing with one arm at side. Pull forward and up with elbow straight. Move shoulder through pain-free range of motion, no further than shoulder height. Repeat 10-15 times per set.  Do 1-2 sessions per day.    Strengthening: Resisted Extension   Attach one end to door.  Hold tubing in one hand, arm forward. Pull arm back, elbow straight. Repeat 10-15 times per set. Do 1-2 sessions per day.   Resisted Horizontal Abduction: Bilateral   Sit or stand, tubing in both hands,  arms out in front. Keeping arms straight, pinch shoulder blades together and stretch arms out. Repeat 10-15 times per set.  Do 1-2 sessions per day.   SHOULDER: External Rotation (Band)    Keep elbow next to body. Holding band, rotate arm away from body. Hold 3 seconds.  10-15 reps, 1-2x/day    **REPEAT ALL WITH BOTH ARMS.

## 2018-08-11 ENCOUNTER — Ambulatory Visit: Payer: Self-pay | Admitting: Occupational Therapy

## 2018-08-11 ENCOUNTER — Ambulatory Visit: Payer: Self-pay

## 2018-08-11 ENCOUNTER — Ambulatory Visit: Payer: 59 | Admitting: Physical Therapy

## 2018-08-11 ENCOUNTER — Encounter: Payer: Self-pay | Admitting: Occupational Therapy

## 2018-08-11 DIAGNOSIS — R41841 Cognitive communication deficit: Secondary | ICD-10-CM

## 2018-08-11 DIAGNOSIS — M25611 Stiffness of right shoulder, not elsewhere classified: Secondary | ICD-10-CM

## 2018-08-11 DIAGNOSIS — R41844 Frontal lobe and executive function deficit: Secondary | ICD-10-CM

## 2018-08-11 DIAGNOSIS — M6281 Muscle weakness (generalized): Secondary | ICD-10-CM

## 2018-08-11 DIAGNOSIS — M25612 Stiffness of left shoulder, not elsewhere classified: Secondary | ICD-10-CM

## 2018-08-11 NOTE — Patient Instructions (Signed)
YOU will need to attempt your homework prior to receiving help. If you get help too soon, your brain won't get the work it needs to rehabilitate.
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
## 2018-08-13 ENCOUNTER — Encounter: Payer: Self-pay | Admitting: Occupational Therapy

## 2018-08-13 ENCOUNTER — Ambulatory Visit: Payer: Self-pay

## 2018-08-13 ENCOUNTER — Ambulatory Visit: Payer: Self-pay | Admitting: Occupational Therapy

## 2018-08-13 ENCOUNTER — Ambulatory Visit: Payer: 59 | Admitting: Physical Therapy

## 2018-08-13 DIAGNOSIS — R41841 Cognitive communication deficit: Secondary | ICD-10-CM

## 2018-08-13 DIAGNOSIS — M25611 Stiffness of right shoulder, not elsewhere classified: Secondary | ICD-10-CM

## 2018-08-13 DIAGNOSIS — M25612 Stiffness of left shoulder, not elsewhere classified: Secondary | ICD-10-CM

## 2018-08-13 DIAGNOSIS — M6281 Muscle weakness (generalized): Secondary | ICD-10-CM

## 2018-08-13 NOTE — Patient Instructions (Addendum)
  Cognitive therapy apps (spend at least 30 minutes a day on these):  Constant Therapy  Peak  Neuronation  Lumosity  We will be on hold until 09-01-18, until you return to work. If you feel you need to you can return at that time, with some ideas about what to work on based on how things are going at work. If you don't feel you need to return, please let me know and I can discharge your file.
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
## 2018-08-18 ENCOUNTER — Encounter: Payer: Self-pay | Admitting: Occupational Therapy

## 2018-08-18 ENCOUNTER — Ambulatory Visit: Payer: Self-pay | Admitting: Occupational Therapy

## 2018-08-18 DIAGNOSIS — M25512 Pain in left shoulder: Secondary | ICD-10-CM

## 2018-08-18 DIAGNOSIS — M25612 Stiffness of left shoulder, not elsewhere classified: Secondary | ICD-10-CM

## 2018-08-18 DIAGNOSIS — M25611 Stiffness of right shoulder, not elsewhere classified: Secondary | ICD-10-CM

## 2018-08-18 DIAGNOSIS — R41844 Frontal lobe and executive function deficit: Secondary | ICD-10-CM

## 2018-08-18 DIAGNOSIS — M6281 Muscle weakness (generalized): Secondary | ICD-10-CM

## 2018-08-18 DIAGNOSIS — M25511 Pain in right shoulder: Secondary | ICD-10-CM

## 2018-08-18 NOTE — Patient Instructions (Signed)
OTHER: Scapular Protraction    Use shoulder muscles to lift chest off floor. Do not lift hips. Hold 5 seconds. 10 reps per set, 3-4x/week.  Turn head to each side in between reps.   Copyright  VHI. All rights reserved.  Prone Plank (Eccentric)    On toes and elbows, pull abdomen in while stabilizing trunk. Slowly lower downward without arching back. Hold 10sec, 10  reps per set, 3-4 days per week.   All Fours Shoulder Flexion / Extension    Place hands and knees shoulder-width apart, rock back and sit on legs, then rock forward over hands and forearms. Repeat 10 times, 3-4x/week  Angry Cat, All Fours    Kneel on hands and knees. Tuck chin and tighten stomach. Exhale and round back upward. Inhale and arch back downward. Hold each position 5 seconds. Repeat 10 times per session. Do 3-4x/week   pper Extremity Extension (All-Fours)   Tighten stomach and raise left arm parallel to floor. Keep trunk rigid. Repeat 10 times per set.  Do 1 sessions per day.   Scapular Retraction (Prone)   Lie with arms at sides. Pinch shoulder blades together and raise arms a few inches from floor.  (thumbs down) Repeat 10 times per set. Do 1 sessions per day.  Scapular Retraction: Abduction / Extension (Prone)   Lie with arms out from sides 90. Pinch shoulder blades together and raise arms a few inches from floor. Palms down.  Repeat 10 times per set. Do 1 sessions per day.
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
## 2018-08-20 ENCOUNTER — Ambulatory Visit: Payer: Self-pay | Admitting: Occupational Therapy

## 2018-08-20 ENCOUNTER — Encounter: Payer: Self-pay | Admitting: Occupational Therapy

## 2018-08-20 DIAGNOSIS — M25612 Stiffness of left shoulder, not elsewhere classified: Secondary | ICD-10-CM

## 2018-08-20 DIAGNOSIS — M25611 Stiffness of right shoulder, not elsewhere classified: Secondary | ICD-10-CM

## 2018-08-20 DIAGNOSIS — M6281 Muscle weakness (generalized): Secondary | ICD-10-CM
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
## 2018-09-16 ENCOUNTER — Ambulatory Visit: Payer: Self-pay | Admitting: Nurse Practitioner

## 2018-09-17 DIAGNOSIS — I10 Essential (primary) hypertension: Secondary | ICD-10-CM

## 2018-09-21 ENCOUNTER — Encounter: Payer: Self-pay | Attending: Registered Nurse | Admitting: Physical Medicine & Rehabilitation

## 2018-09-21 DIAGNOSIS — S0291XD Unspecified fracture of skull, subsequent encounter for fracture with routine healing: Secondary | ICD-10-CM | POA: Insufficient documentation

## 2018-09-21 DIAGNOSIS — S069X9D Unspecified intracranial injury with loss of consciousness of unspecified duration, subsequent encounter: Secondary | ICD-10-CM | POA: Insufficient documentation

## 2018-09-21 DIAGNOSIS — M25511 Pain in right shoulder: Secondary | ICD-10-CM | POA: Insufficient documentation

## 2018-09-21 DIAGNOSIS — Z833 Family history of diabetes mellitus: Secondary | ICD-10-CM | POA: Insufficient documentation

## 2018-09-21 DIAGNOSIS — Z87891 Personal history of nicotine dependence: Secondary | ICD-10-CM | POA: Insufficient documentation

## 2018-09-21 DIAGNOSIS — Z9889 Other specified postprocedural states: Secondary | ICD-10-CM | POA: Insufficient documentation

## 2018-09-21 DIAGNOSIS — M25512 Pain in left shoulder: Secondary | ICD-10-CM | POA: Insufficient documentation

## 2018-09-21 DIAGNOSIS — M545 Low back pain: Secondary | ICD-10-CM | POA: Insufficient documentation

## 2018-09-21 DIAGNOSIS — Z8249 Family history of ischemic heart disease and other diseases of the circulatory system: Secondary | ICD-10-CM | POA: Insufficient documentation

## 2018-09-21 DIAGNOSIS — I10 Essential (primary) hypertension: Secondary | ICD-10-CM | POA: Insufficient documentation

## 2018-10-06 ENCOUNTER — Ambulatory Visit: Payer: Self-pay | Admitting: Nurse Practitioner

## 2018-10-27 ENCOUNTER — Ambulatory Visit: Payer: Self-pay | Admitting: Nurse Practitioner
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
